# Patient Record
Sex: Male | Born: 1950 | Race: White | Hispanic: No | Marital: Married | Smoking: Former smoker
Health system: Southern US, Community
[De-identification: ages and names within clinical notes are randomized; demographics above are authoritative.]

## PROBLEM LIST (undated history)

## (undated) DIAGNOSIS — F32A Depression, unspecified: Secondary | ICD-10-CM

## (undated) DIAGNOSIS — F419 Anxiety disorder, unspecified: Secondary | ICD-10-CM

## (undated) DIAGNOSIS — E739 Lactose intolerance, unspecified: Secondary | ICD-10-CM

## (undated) DIAGNOSIS — G4733 Obstructive sleep apnea (adult) (pediatric): Secondary | ICD-10-CM

## (undated) DIAGNOSIS — N5089 Other specified disorders of the male genital organs: Secondary | ICD-10-CM

## (undated) DIAGNOSIS — M792 Neuralgia and neuritis, unspecified: Secondary | ICD-10-CM

## (undated) DIAGNOSIS — R42 Dizziness and giddiness: Secondary | ICD-10-CM

## (undated) DIAGNOSIS — C61 Malignant neoplasm of prostate: Secondary | ICD-10-CM

## (undated) DIAGNOSIS — I499 Cardiac arrhythmia, unspecified: Secondary | ICD-10-CM

## (undated) DIAGNOSIS — R5383 Other fatigue: Secondary | ICD-10-CM

## (undated) DIAGNOSIS — K219 Gastro-esophageal reflux disease without esophagitis: Secondary | ICD-10-CM

## (undated) DIAGNOSIS — E669 Obesity, unspecified: Secondary | ICD-10-CM

## (undated) DIAGNOSIS — I272 Pulmonary hypertension, unspecified: Secondary | ICD-10-CM

## (undated) DIAGNOSIS — F985 Adult onset fluency disorder: Secondary | ICD-10-CM

## (undated) DIAGNOSIS — F329 Major depressive disorder, single episode, unspecified: Secondary | ICD-10-CM

## (undated) DIAGNOSIS — G119 Hereditary ataxia, unspecified: Secondary | ICD-10-CM

## (undated) DIAGNOSIS — I8390 Asymptomatic varicose veins of unspecified lower extremity: Secondary | ICD-10-CM

## (undated) DIAGNOSIS — J329 Chronic sinusitis, unspecified: Secondary | ICD-10-CM

## (undated) DIAGNOSIS — R0602 Shortness of breath: Secondary | ICD-10-CM

## (undated) DIAGNOSIS — G4700 Insomnia, unspecified: Secondary | ICD-10-CM

## (undated) DIAGNOSIS — E785 Hyperlipidemia, unspecified: Secondary | ICD-10-CM

## (undated) DIAGNOSIS — M5410 Radiculopathy, site unspecified: Secondary | ICD-10-CM

## (undated) DIAGNOSIS — M1310 Monoarthritis, not elsewhere classified, unspecified site: Secondary | ICD-10-CM

## (undated) HISTORY — DX: Shortness of breath: R06.02

## (undated) HISTORY — DX: Chronic sinusitis, unspecified: J32.9

## (undated) HISTORY — DX: Morbid (severe) obesity due to excess calories: E66.01

## (undated) HISTORY — DX: Monoarthritis, not elsewhere classified, unspecified site: M13.10

## (undated) HISTORY — PX: APPENDECTOMY: SHX54

## (undated) HISTORY — DX: Malignant neoplasm of prostate: C61

## (undated) HISTORY — DX: Adult onset fluency disorder: F98.5

## (undated) HISTORY — DX: Other fatigue: R53.83

## (undated) HISTORY — DX: Obesity, unspecified: E66.9

## (undated) HISTORY — PX: VASCULAR SURGERY: SHX849

## (undated) HISTORY — PX: CARDIAC CATHETERIZATION: SHX172

## (undated) HISTORY — PX: TONSILLECTOMY: SUR1361

## (undated) HISTORY — DX: Asymptomatic varicose veins of unspecified lower extremity: I83.90

## (undated) HISTORY — DX: Neuralgia and neuritis, unspecified: M79.2

## (undated) HISTORY — DX: Obstructive sleep apnea (adult) (pediatric): G47.33

## (undated) HISTORY — DX: Major depressive disorder, single episode, unspecified: F32.9

## (undated) HISTORY — PX: TONSILLECTOMY: SHX5217

## (undated) HISTORY — DX: Pulmonary hypertension, unspecified: I27.20

## (undated) HISTORY — DX: Anxiety disorder, unspecified: F41.9

## (undated) HISTORY — DX: Lactose intolerance, unspecified: E73.9

## (undated) HISTORY — DX: Insomnia, unspecified: G47.00

## (undated) HISTORY — DX: Radiculopathy, site unspecified: M54.10

## (undated) HISTORY — DX: Gastro-esophageal reflux disease without esophagitis: K21.9

## (undated) HISTORY — DX: Dizziness and giddiness: R42

## (undated) HISTORY — DX: Hereditary ataxia, unspecified: G11.9

## (undated) HISTORY — DX: Hyperlipidemia, unspecified: E78.5

## (undated) HISTORY — DX: Depression, unspecified: F32.A

---

## 2004-01-05 HISTORY — PX: OTHER SURGICAL HISTORY: SHX169

## 2004-06-13 HISTORY — PX: COLONOSCOPY: SHX174

## 2004-08-06 HISTORY — PX: OTHER SURGICAL HISTORY: SHX169

## 2006-08-06 HISTORY — PX: CHOLECYSTECTOMY: SHX55

## 2007-02-22 ENCOUNTER — Emergency Department (HOSPITAL_COMMUNITY): Admission: EM | Admit: 2007-02-22 | Discharge: 2007-02-22 | Payer: Self-pay | Admitting: Emergency Medicine

## 2007-02-26 ENCOUNTER — Encounter: Payer: Self-pay | Admitting: Cardiology

## 2007-04-03 ENCOUNTER — Encounter: Payer: Self-pay | Admitting: Cardiology

## 2007-04-11 ENCOUNTER — Encounter: Payer: Self-pay | Admitting: Cardiology

## 2008-04-03 ENCOUNTER — Encounter: Payer: Self-pay | Admitting: Cardiology

## 2008-09-08 ENCOUNTER — Encounter: Payer: Self-pay | Admitting: Cardiology

## 2008-09-21 ENCOUNTER — Encounter: Payer: Self-pay | Admitting: Cardiology

## 2008-10-06 ENCOUNTER — Emergency Department (HOSPITAL_COMMUNITY): Admission: EM | Admit: 2008-10-06 | Discharge: 2008-10-06 | Payer: Self-pay | Admitting: Emergency Medicine

## 2008-10-13 ENCOUNTER — Encounter: Payer: Self-pay | Admitting: Cardiology

## 2009-02-10 ENCOUNTER — Emergency Department (HOSPITAL_COMMUNITY): Admission: EM | Admit: 2009-02-10 | Discharge: 2009-02-10 | Payer: Self-pay | Admitting: Emergency Medicine

## 2009-07-03 ENCOUNTER — Emergency Department (HOSPITAL_COMMUNITY): Admission: EM | Admit: 2009-07-03 | Discharge: 2009-07-03 | Payer: Self-pay | Admitting: Emergency Medicine

## 2010-08-06 HISTORY — PX: PROSTATE BIOPSY: SHX241

## 2010-08-06 HISTORY — PX: OTHER SURGICAL HISTORY: SHX169

## 2010-08-28 ENCOUNTER — Encounter: Payer: Self-pay | Admitting: Neurology

## 2010-09-04 ENCOUNTER — Ambulatory Visit
Admission: RE | Admit: 2010-09-04 | Discharge: 2010-09-05 | Payer: Self-pay | Source: Home / Self Care | Attending: Radiation Oncology | Admitting: Radiation Oncology

## 2010-09-07 ENCOUNTER — Ambulatory Visit: Payer: BC Managed Care – PPO | Attending: Radiation Oncology | Admitting: Radiation Oncology

## 2010-09-07 DIAGNOSIS — C61 Malignant neoplasm of prostate: Secondary | ICD-10-CM | POA: Insufficient documentation

## 2010-09-15 ENCOUNTER — Other Ambulatory Visit: Payer: Self-pay | Admitting: Urology

## 2010-09-15 DIAGNOSIS — C61 Malignant neoplasm of prostate: Secondary | ICD-10-CM

## 2010-09-18 ENCOUNTER — Encounter (HOSPITAL_BASED_OUTPATIENT_CLINIC_OR_DEPARTMENT_OTHER)
Admission: RE | Admit: 2010-09-18 | Discharge: 2010-09-18 | Disposition: A | Discharge status: Home / Self Care | Payer: BC Managed Care – PPO | Source: Ambulatory Visit | Attending: Urology | Admitting: Urology

## 2010-09-18 ENCOUNTER — Ambulatory Visit
Admission: RE | Admit: 2010-09-18 | Discharge: 2010-09-18 | Disposition: A | Discharge status: Home / Self Care | Payer: BC Managed Care – PPO | Source: Ambulatory Visit | Attending: Urology | Admitting: Urology

## 2010-09-18 DIAGNOSIS — Z01812 Encounter for preprocedural laboratory examination: Secondary | ICD-10-CM | POA: Insufficient documentation

## 2010-09-18 DIAGNOSIS — C61 Malignant neoplasm of prostate: Secondary | ICD-10-CM

## 2010-10-24 LAB — COMPREHENSIVE METABOLIC PANEL
ALT: 22 U/L (ref 0–53)
AST: 21 U/L (ref 0–37)
Albumin: 3.9 g/dL (ref 3.5–5.2)
Calcium: 8.8 mg/dL (ref 8.4–10.5)
Chloride: 108 mEq/L (ref 96–112)
Creatinine, Ser: 1.06 mg/dL (ref 0.4–1.5)
GFR calc Af Amer: >60 mL/min (ref >60)
Sodium: 140 mEq/L (ref 135–145)
Total Bilirubin: 0.6 mg/dL (ref 0.3–1.2)

## 2010-10-24 LAB — CBC
HCT: 42.3 % (ref 39.0–52.0)
Hemoglobin: 13.8 g/dL (ref 13.0–17.0)
MCH: 29.3 pg (ref 26.0–34.0)
MCV: 89.8 fL (ref 78.0–100.0)
Platelets: 199 10*3/uL (ref 150–400)
RBC: 4.71 MIL/uL (ref 4.22–5.81)
WBC: 5.6 10*3/uL (ref 4.0–10.5)

## 2010-10-24 LAB — APTT: aPTT: 31 seconds (ref 24–37)

## 2010-10-31 ENCOUNTER — Ambulatory Visit (HOSPITAL_BASED_OUTPATIENT_CLINIC_OR_DEPARTMENT_OTHER)
Admission: RE | Admit: 2010-10-31 | Discharge: 2010-10-31 | Disposition: A | Discharge status: Home / Self Care | Payer: BC Managed Care – PPO | Source: Ambulatory Visit | Attending: Urology | Admitting: Urology

## 2010-10-31 ENCOUNTER — Ambulatory Visit (HOSPITAL_COMMUNITY): Payer: BC Managed Care – PPO | Attending: Urology

## 2010-10-31 DIAGNOSIS — Z01812 Encounter for preprocedural laboratory examination: Secondary | ICD-10-CM | POA: Insufficient documentation

## 2010-10-31 DIAGNOSIS — C61 Malignant neoplasm of prostate: Secondary | ICD-10-CM | POA: Insufficient documentation

## 2010-10-31 DIAGNOSIS — Z01810 Encounter for preprocedural cardiovascular examination: Secondary | ICD-10-CM | POA: Insufficient documentation

## 2010-11-02 NOTE — Op Note (Signed)
  NAMECHRISTIE, Richard Bradshaw                 ACCOUNT NO.:  [id number]  MEDICAL RECORD NO.:  [id number]          PATIENT TYPE:  REC  LOCATION:  [facility]                         FACILITY:  [facility]  PHYSICIAN:  Maretta Bees. Vonita Moss, M.D.DATE OF BIRTH:  1951-07-28  DATE OF PROCEDURE:  10/31/2010 DATE OF DISCHARGE:  09/05/2010                              OPERATIVE REPORT   PREOPERATIVE DIAGNOSIS:  Prostatic carcinoma.  POSTOPERATIVE DIAGNOSIS:  Prostatic carcinoma.  PROCEDURE:  Radioactive seed implantation and cystoscopy.  SURGEON:  Maretta Bees. Vonita Moss, M.D.  ANESTHESIA:  General.  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 He was counseled about observation versus definitive therapy.  He has opted for radioactive seed implantation.  He is voiding fairly well.  ASSISTANT:  Maryln Gottron, M.D.  DESCRIPTION OF PROCEDURE:  The patient is brought to the operating room, placed in lithotomy position.  The external genitalia and lower abdomen and perineum prepped and draped in the usual fashion.  He underwent transrectal ultrasound treatment planning.  With Foley catheter in place, he had radioactive seed implantation with placement of 25 needles and delivery of 83 seeds with total apparent activity of 39.3420 mCi using I-125 seeds.  Fluoroscopic and ultrasonic visualization demonstrated good placement of the seeds.  The Foley catheter was removed.  He underwent cystoscopy, and there was no evidence of seeds or injury to the prostatic urethra or bladder.  Foley catheter was reinserted, and he was taken to recovery room in good condition having tolerated the procedure well.     Maretta Bees. Vonita Moss, M.D.     LJP/MEDQ  D:  10/31/2010  T:  10/31/2010  Job:  [id number]  cc:   Maryln Gottron, M.D. Fax: [phone]  Electronically Signed by Larey Dresser M.D. on 11/02/2010 82:95:62 PM

## 2010-11-12 LAB — GLUCOSE, CAPILLARY: Glucose-Capillary: 124 mg/dL — ABNORMAL HIGH (ref 70–99)

## 2010-11-16 LAB — POCT I-STAT, CHEM 8
BUN: 25 mg/dL — ABNORMAL HIGH (ref 6–23)
Chloride: 105 mEq/L (ref 96–112)
Creatinine, Ser: 1.4 mg/dL (ref 0.4–1.5)
Sodium: 141 mEq/L (ref 135–145)

## 2010-11-21 ENCOUNTER — Other Ambulatory Visit: Payer: Self-pay | Admitting: Radiation Oncology

## 2010-11-23 ENCOUNTER — Other Ambulatory Visit: Payer: Self-pay | Admitting: Radiation Oncology

## 2010-11-23 DIAGNOSIS — C61 Malignant neoplasm of prostate: Secondary | ICD-10-CM

## 2010-11-29 ENCOUNTER — Ambulatory Visit (HOSPITAL_COMMUNITY)
Admission: RE | Admit: 2010-11-29 | Discharge: 2010-11-29 | Disposition: A | Discharge status: Home / Self Care | Payer: BC Managed Care – PPO | Source: Ambulatory Visit | Attending: Radiation Oncology | Admitting: Radiation Oncology

## 2010-11-29 DIAGNOSIS — C61 Malignant neoplasm of prostate: Secondary | ICD-10-CM

## 2011-04-29 IMAGING — CR DG CHEST 2V
2 series · 2 of 2 positions shown · non-contrast
Comparison: 02/22/2007

CLINICAL DATA: Prostate cancer.  Preoperative evaluation.

CHEST - 2 VIEW

[w chest pa]
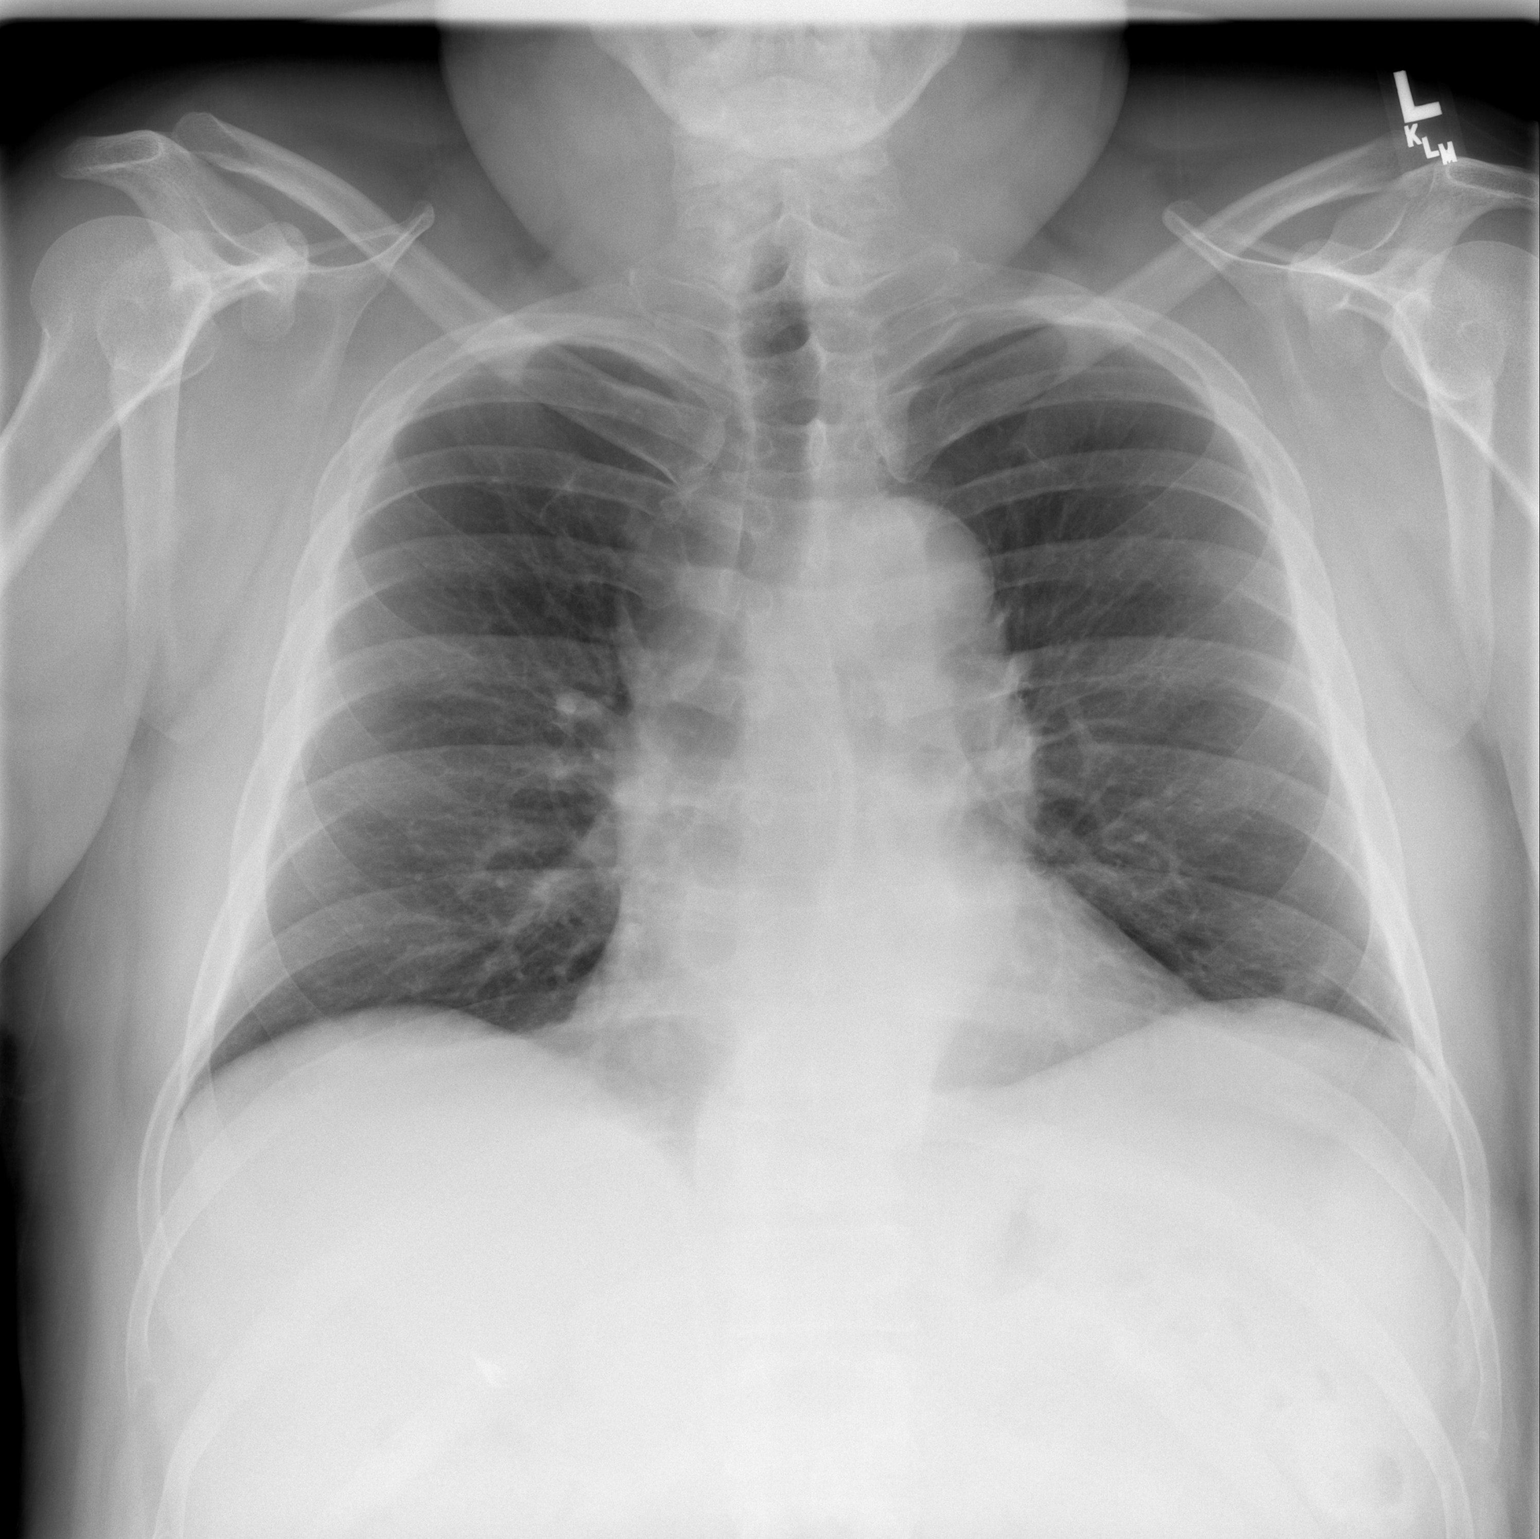

[w chest lat]
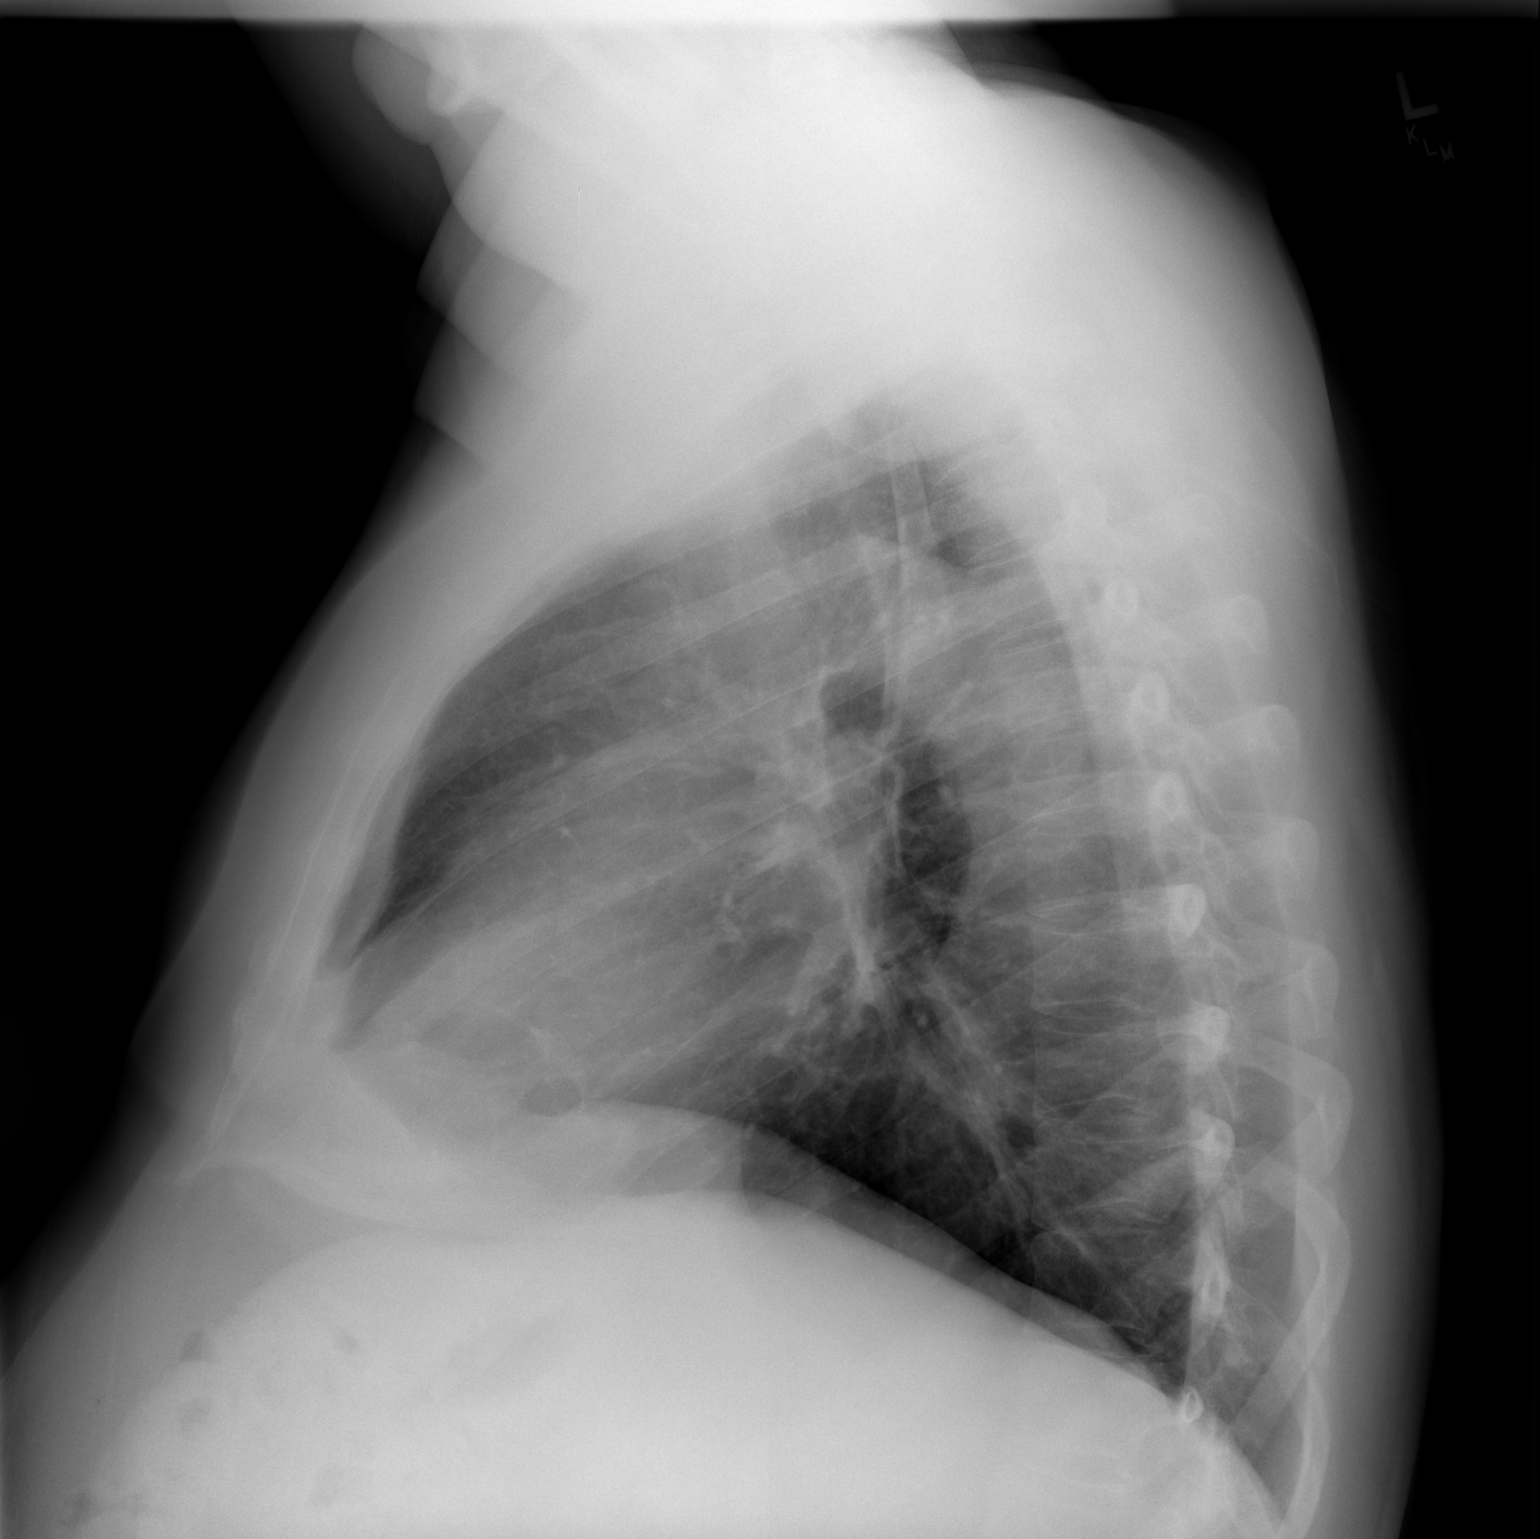

[2 of 2 positions shown; findings below may reference images not displayed]

FINDINGS: The lungs are clear bilaterally.  Minimal atelectasis is
seen at the lung bases, bilaterally.  No confluent airspace
opacities, pleural effuions or pneumothoracies are seen.  The heart
is normal in size and contour.  The upper abdomen and osseous
structures are normal.
IMPRESSION: No acute cardiopulmonary disease.

## 2011-05-21 LAB — BASIC METABOLIC PANEL
Chloride: 112
GFR calc Af Amer: >60
Potassium: 4

## 2011-05-21 LAB — POCT CARDIAC MARKERS
CKMB, poc: <1 — ABNORMAL LOW
Myoglobin, poc: 61.2
Operator id: 1211
Troponin i, poc: <0.05
Troponin i, poc: <0.05

## 2011-05-21 LAB — CBC
HCT: 43.1
Hemoglobin: 14.7
MCV: 85.6
RBC: 5.03
WBC: 7

## 2011-08-16 ENCOUNTER — Ambulatory Visit (INDEPENDENT_AMBULATORY_CARE_PROVIDER_SITE_OTHER): Payer: BC Managed Care – PPO | Admitting: Cardiology

## 2011-08-16 ENCOUNTER — Encounter: Payer: Self-pay | Admitting: Cardiology

## 2011-08-16 DIAGNOSIS — I2789 Other specified pulmonary heart diseases: Secondary | ICD-10-CM

## 2011-08-16 DIAGNOSIS — I10 Essential (primary) hypertension: Secondary | ICD-10-CM

## 2011-08-16 DIAGNOSIS — G4730 Sleep apnea, unspecified: Secondary | ICD-10-CM

## 2011-08-16 DIAGNOSIS — I4891 Unspecified atrial fibrillation: Secondary | ICD-10-CM

## 2011-08-16 DIAGNOSIS — G4733 Obstructive sleep apnea (adult) (pediatric): Secondary | ICD-10-CM | POA: Insufficient documentation

## 2011-08-16 DIAGNOSIS — I272 Pulmonary hypertension, unspecified: Secondary | ICD-10-CM | POA: Insufficient documentation

## 2011-08-16 DIAGNOSIS — R079 Chest pain, unspecified: Secondary | ICD-10-CM | POA: Insufficient documentation

## 2011-08-16 NOTE — Patient Instructions (Signed)
We will request copies of your records from [facility] and Dr. Vickey Huger.  We will schedule you for lab work, Echocardiogram, and Heart catheterization in February.

## 2011-08-16 NOTE — Progress Notes (Signed)
Richard Bradshaw Date of Birth: 08-19-1950 Medical Record #[id number]  History of Present Illness: [name] is a pleasant 61 year old white male seen at the request of Dr. Ashley Royalty for chest pain. He has a complex medical history. He has a history of morbid obesity with obstructive sleep apnea. He has a history of significant pulmonary hypertension. He reports that he has had chest pain off and on for the past 5 years. Now his pain is almost constant. He has had extensive evaluation at the hospital in [facility] as well as in [place]. He reports that he has had several nuclear stress tests in the past and has had at least 2 heart catheterizations. His last heart catheterization in 2008 showed no significant coronary disease but he did have significant pulmonary hypertension. He apparently has been offered PDE 5 inhibitors in the past but was unable to afford these medications. He describes his pain as a heaviness or pain in the midchest that radiates into his left arm. It is associated with exertion. There are no alleviating factors. He does have chronic dyspnea but reports his oxygen levels have been normal.  No current outpatient prescriptions on file prior to visit.    Allergies  Allergen Reactions  . Penicillins     Past Medical History  Diagnosis Date  . Diabetes mellitus     type 2  . 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 Hyperlipidemia   . Obesity   . Anxiety   . Adult onset fluency disorder   . Depressive disorder   . Obstructive sleep apnea     on CPAP  . Cerebellar ataxia   . Essential hypertension   . Angina pectoris   . Varicose vein   . Sinusitis   . Esophageal reflux   . Hypertrophy (benign) of prostate   . Hyperplasia of prostate   . Monoarthritis     lower leg  . Neuralgia   . Neuritis   . Radiculitis   . Insomnia   . Fatigue   . SOB (shortness of breath)   . Elevated prostate specific antigen (PSA)   .  Dizziness   . Pulmonary HTN     Past Surgical History  Procedure Date  . Appendectomy   . Tonsillectomy   . Cholecystectomy 2008  . Colonoscopy 06/13/2004  . Other surgical history 01/05/2004    ETT--Nuclear  . Atrial fibrillation ablation 2006    x2    History  Smoking status  . Former Smoker  . Quit date: 08/15/1978  Smokeless tobacco  . Not on file    History  Alcohol Use No    Family History  Problem Relation Age of Onset  . Hypertension Mother   . Diabetes Mother   . Cancer Father   . Hypertension Sister     Review of Systems: The review of systems is positive for dyspnea and chest pain as noted. He denies any increased abdominal girth or edema. He has no orthopnea. He has had no cough or fever. All other systems were reviewed and are negative.  Physical Exam: BP 128/78  Pulse 99  Ht 5\' 9"  (1.753 m)  Wt 244 lb (110.678 kg)  BMI 36.03 kg/m2 He is a morbidly obese white male in no acute distress.The patient is alert and oriented x 3.  The mood and affect are normal.  The skin is warm and dry.  Color is normal.  The HEENT exam reveals that the sclera are nonicteric.  The mucous membranes are moist.  The carotids are 2+ without bruits.  There is no thyromegaly.  There is no JVD.  The lungs are clear.  The chest wall is non tender.  The heart exam reveals a regular rate with a normal S1 and S2. Heart sounds are distant There are no murmurs, gallops, or rubs.  The PMI is not well palpated.   Abdominal exam reveals good bowel sounds.  There is no guarding or rebound.  There is no hepatosplenomegaly or tenderness.  There are no masses.  Exam of the legs reveal no clubbing, cyanosis, or edema.  The legs are without rashes.  The distal pulses are intact.  Cranial nerves II - XII are intact.  Motor and sensory functions are intact.  The gait is normal. LABORATORY DATA:   Assessment / Plan: ECG today demonstrates normal sinus rhythm with occasional PVCs. It is otherwise  normal.

## 2011-08-16 NOTE — Assessment & Plan Note (Signed)
I suspect that his current chest pain symptoms are related to his pulmonary hypertension. It has been several years since was formally evaluated. I think we need to repeat an echocardiogram and a right and left heart catheterization. We will rule out coronary disease. If indeed he has significant pulmonary hypertension then we will need to reconsider options for treatment. I've asked that he see one of our pulmonary specialists. I have requested copies of his records from previous evaluations. We will tentatively schedule these procedures in February since he is switching to [facility] on February 1.

## 2011-08-22 ENCOUNTER — Telehealth: Payer: Self-pay | Admitting: Cardiology

## 2011-08-22 NOTE — Telephone Encounter (Addendum)
Faxed 2 Release Forms to Obtain all Records for Dr.Jordan to :  1. [facility] @ [phone] 2.Dr.Doheimer @ [phone] 08/22/11/KM  Records Received from Dr.Doheimer's Office Gave to [facility] 08/22/11/KM  Records Received from [facility] gave to [facility]  08/23/11/KM

## 2011-08-29 ENCOUNTER — Institutional Professional Consult (permissible substitution): Payer: BC Managed Care – PPO | Admitting: Cardiology

## 2011-08-31 ENCOUNTER — Institutional Professional Consult (permissible substitution): Payer: BC Managed Care – PPO | Admitting: Cardiology

## 2011-09-12 ENCOUNTER — Other Ambulatory Visit: Payer: Self-pay

## 2011-09-12 ENCOUNTER — Other Ambulatory Visit: Payer: Self-pay | Admitting: Cardiology

## 2011-09-12 DIAGNOSIS — I2789 Other specified pulmonary heart diseases: Secondary | ICD-10-CM

## 2011-09-12 DIAGNOSIS — I4891 Unspecified atrial fibrillation: Secondary | ICD-10-CM

## 2011-09-12 DIAGNOSIS — I272 Pulmonary hypertension, unspecified: Secondary | ICD-10-CM

## 2011-09-13 ENCOUNTER — Ambulatory Visit (INDEPENDENT_AMBULATORY_CARE_PROVIDER_SITE_OTHER)
Admission: RE | Admit: 2011-09-13 | Discharge: 2011-09-13 | Disposition: A | Discharge status: Home / Self Care | Payer: Medicare Other | Source: Ambulatory Visit | Attending: Cardiology | Admitting: Cardiology

## 2011-09-13 ENCOUNTER — Other Ambulatory Visit (INDEPENDENT_AMBULATORY_CARE_PROVIDER_SITE_OTHER): Payer: Medicare Other | Admitting: *Deleted

## 2011-09-13 DIAGNOSIS — I4891 Unspecified atrial fibrillation: Secondary | ICD-10-CM

## 2011-09-13 DIAGNOSIS — R079 Chest pain, unspecified: Secondary | ICD-10-CM

## 2011-09-13 DIAGNOSIS — I2789 Other specified pulmonary heart diseases: Secondary | ICD-10-CM

## 2011-09-13 LAB — BASIC METABOLIC PANEL
BUN: 16 mg/dL (ref 6–23)
CO2: 30 mEq/L (ref 19–32)
Calcium: 9.2 mg/dL (ref 8.4–10.5)
Creatinine, Ser: 1 mg/dL (ref 0.4–1.5)

## 2011-09-13 LAB — APTT: aPTT: 26 s (ref 21.7–28.8)

## 2011-09-13 LAB — PROTIME-INR
INR: 0.9 ratio (ref 0.8–1.0)
Prothrombin Time: 10.1 s — ABNORMAL LOW (ref 10.2–12.4)

## 2011-09-13 LAB — CBC WITH DIFFERENTIAL/PLATELET
Basophils Absolute: 0 10*3/uL (ref 0.0–0.1)
Basophils Relative: 0.7 % (ref 0.0–3.0)
Eosinophils Absolute: 0.1 10*3/uL (ref 0.0–0.7)
Lymphocytes Relative: 32.3 % (ref 12.0–46.0)
MCHC: 33.4 g/dL (ref 30.0–36.0)
Neutrophils Relative %: 57.7 % (ref 43.0–77.0)
Platelets: 204 10*3/uL (ref 150.0–400.0)
RBC: 4.61 Mil/uL (ref 4.22–5.81)

## 2011-09-13 NOTE — Progress Notes (Signed)
Quick Note:  High blood sugar, otherwise labs ok. Andrina Locken [place] MD, [facility]   ______

## 2011-09-20 ENCOUNTER — Telehealth: Payer: Self-pay | Admitting: Cardiology

## 2011-09-20 ENCOUNTER — Encounter (HOSPITAL_BASED_OUTPATIENT_CLINIC_OR_DEPARTMENT_OTHER)
Admission: RE | Disposition: A | Discharge status: Home / Self Care | Payer: Self-pay | Source: Ambulatory Visit | Attending: Cardiology

## 2011-09-20 ENCOUNTER — Inpatient Hospital Stay (HOSPITAL_BASED_OUTPATIENT_CLINIC_OR_DEPARTMENT_OTHER)
Admission: RE | Admit: 2011-09-20 | Discharge: 2011-09-20 | Disposition: A | Discharge status: Home / Self Care | Payer: Medicare Other | Source: Ambulatory Visit | Attending: Cardiology | Admitting: Cardiology

## 2011-09-20 DIAGNOSIS — R0989 Other specified symptoms and signs involving the circulatory and respiratory systems: Secondary | ICD-10-CM | POA: Insufficient documentation

## 2011-09-20 DIAGNOSIS — R0602 Shortness of breath: Secondary | ICD-10-CM

## 2011-09-20 DIAGNOSIS — I272 Pulmonary hypertension, unspecified: Secondary | ICD-10-CM

## 2011-09-20 DIAGNOSIS — R0609 Other forms of dyspnea: Secondary | ICD-10-CM | POA: Insufficient documentation

## 2011-09-20 LAB — POCT I-STAT 3, VENOUS BLOOD GAS (G3P V)
pCO2, Ven: 47.4 mmHg (ref 45.0–50.0)
pH, Ven: 7.336 — ABNORMAL HIGH (ref 7.250–7.300)

## 2011-09-20 LAB — POCT I-STAT GLUCOSE
Glucose, Bld: 104 mg/dL — ABNORMAL HIGH (ref 70–99)
Operator id: 141321

## 2011-09-20 SURGERY — JV LEFT AND RIGHT HEART CATHETERIZATION WITH CORONARY ANGIOGRAM
Anesthesia: Moderate Sedation

## 2011-09-20 MED ORDER — SODIUM CHLORIDE 0.9 % IJ SOLN: 3.0000 mL | Freq: Two times a day (BID) | INTRAMUSCULAR | Status: DC

## 2011-09-20 MED ORDER — SODIUM CHLORIDE 0.9 % IJ SOLN: 3.0000 mL | INTRAMUSCULAR | Status: DC | PRN

## 2011-09-20 MED ORDER — SODIUM CHLORIDE 0.9 % IV SOLN: 250.0000 mL | INTRAVENOUS | Status: DC | PRN

## 2011-09-20 MED ORDER — SODIUM CHLORIDE 0.9 % IV SOLN
INTRAVENOUS | Status: DC
  Administered 2011-09-20: 09:00:00 via INTRAVENOUS

## 2011-09-20 MED ORDER — DIAZEPAM 5 MG PO TABS
5.0000 mg | ORAL_TABLET | ORAL | Status: AC
  Administered 2011-09-20: 5 mg via ORAL

## 2011-09-20 MED ORDER — ASPIRIN 81 MG PO CHEW
324.0000 mg | CHEWABLE_TABLET | ORAL | Status: AC
  Administered 2011-09-20: 324 mg via ORAL

## 2011-09-20 MED ORDER — ACETAMINOPHEN 325 MG PO TABS: 650.0000 mg | ORAL_TABLET | ORAL | Status: DC | PRN

## 2011-09-20 MED ORDER — SODIUM CHLORIDE 0.9 % IV SOLN: INTRAVENOUS | Status: DC

## 2011-09-20 MED ORDER — ONDANSETRON HCL 4 MG/2ML IJ SOLN: 4.0000 mg | Freq: Four times a day (QID) | INTRAMUSCULAR | Status: DC | PRN

## 2011-09-20 NOTE — Telephone Encounter (Signed)
Patient's wife called,stated patient had a cath today and had several questions for Dr.Jordan.States when patient had cath before he was told he had mild pulmonary htn,today he was told no pulmonary htn,during cath Dr.Hochrein noticed sob,sleep apnea,patient's b/p has been elevated for the past 2 weeks with lower reading in 90's.Wife stated she wanted to get Dr.Jordan's opinion on these questions.Wife was told I will let Dr.Jordan know tomorrow 09/21/11 and I will call her back.

## 2011-09-20 NOTE — Progress Notes (Signed)
Bedrest begins @ 1130, Dr. Antoine Poche in to discuss results.

## 2011-09-20 NOTE — Procedures (Signed)
  Cardiac Catheterization Procedure Note  Name: Richard Bradshaw MRN: [id number] DOB: 29-Mar-1951  Procedure: Right Heart Cath, Left Heart Cath, Selective Coronary Angiography, LV angiography  Indication:    Procedural Details: The right groin was prepped, draped, and anesthetized with 1% lidocaine. Using the modified Seldinger technique a 4 French sheath was placed in the right femoral artery and a 7 French sheath was placed in the right femoral vein. A Swan-Ganz catheter was used for the right heart catheterization. Standard protocol was followed for recording of right heart pressures and sampling of oxygen saturations. Fick cardiac output was calculated. Standard Judkins catheters were used for selective coronary angiography and left ventriculography. There were no immediate procedural complications. The patient was transferred to the post catheterization recovery area for further monitoring.  Procedural Findings: Hemodynamics:               RA 2    RV 26/4    PA 22/9 mean 16    PCWP 3    LV 141/13    AO 133/82   Oxygen saturations:    PA 66    AO 95   Cardiac Output (Fick) 5.2                             Cardiac Index (Fick) 2.3  Coronary angiography: Coronary dominance: right  Left mainstem: Normal  Left anterior descending (LAD): Proximal 25%.  Mid diagonal large and normal.  Left circumflex (LCx): Normal AV groove, OM 1 normal, OM 2 normal  Right coronary artery (RCA): Large dominant normal, PDA large normal, PL large normal.  Left ventriculography: Left ventricular systolic function is normal, LVEF is estimated at 55-65%, there is no significant mitral regurgitation   Final Conclusions:  Mild coronary plaque, NL LV function and normal right heart pressures.  Recommendations: Further work up of dypsnea per Dr. [place].  No obvious cardiac etiology and no pulmonary hypertension on this study.   Rollene Rotunda 09/20/2011, 11:15 AM

## 2011-09-20 NOTE — Telephone Encounter (Signed)
New msg Pt's wife wants to talk to you about heart cath her husband had today. Please call he back

## 2011-09-20 NOTE — Progress Notes (Signed)
Discharge instructions completed with patient and wife, ambulated to bathroom without bleeding from right groin site.  Discharged to home via wheelchair with wife.

## 2011-09-20 NOTE — Interval H&P Note (Signed)
History and Physical Interval Note:  09/20/2011 10:25 AM  Richard Bradshaw  has presented today for surgery, with the diagnosis of chest pain  The various methods of treatment have been discussed with the patient and family. After consideration of risks, benefits and other options for treatment, the patient has consented to  Procedure(s) (LRB): JV LEFT AND RIGHT HEART CATHETERIZATION WITH CORONARY ANGIOGRAM (N/A) as a surgical intervention .  The patients' history has been reviewed, patient examined, no change in status, stable for surgery.  I have reviewed the patients' chart and labs.  Questions were answered to the patient's satisfaction.     Richard Bradshaw

## 2011-09-20 NOTE — H&P (View-Only) (Signed)
Quick Note:  High blood sugar, otherwise labs ok. Alara Daniel MD, FACC   ______ 

## 2011-09-21 NOTE — Telephone Encounter (Signed)
Patient's wife was called and told Dr.Jordan was told of questions and advised needs to keep appointment with [name] NP 10/02/11.Advised to keep diary of B/P readings and bring with you to appointment.

## 2011-09-25 ENCOUNTER — Other Ambulatory Visit: Payer: Self-pay

## 2011-09-25 ENCOUNTER — Ambulatory Visit (HOSPITAL_COMMUNITY): Payer: Medicare Other | Attending: Cardiology

## 2011-09-25 DIAGNOSIS — I2789 Other specified pulmonary heart diseases: Secondary | ICD-10-CM

## 2011-09-25 DIAGNOSIS — I4891 Unspecified atrial fibrillation: Secondary | ICD-10-CM

## 2011-09-25 DIAGNOSIS — I059 Rheumatic mitral valve disease, unspecified: Secondary | ICD-10-CM | POA: Insufficient documentation

## 2011-09-25 DIAGNOSIS — R072 Precordial pain: Secondary | ICD-10-CM

## 2011-09-25 DIAGNOSIS — I079 Rheumatic tricuspid valve disease, unspecified: Secondary | ICD-10-CM | POA: Insufficient documentation

## 2011-09-26 ENCOUNTER — Encounter: Payer: Self-pay | Admitting: Cardiology

## 2011-09-26 ENCOUNTER — Telehealth: Payer: Self-pay | Admitting: Cardiology

## 2011-09-26 NOTE — Telephone Encounter (Signed)
Returned a page from Richard Bradshaw concerning Richard blood pressure. I spoke with him and Richard Bradshaw who report that Richard BP has been elevated and he has had a headache since yesterday. Richard BP was 150/92 yesterday and 164/99 this evening. He denies visual changes, dizziness, syncope, sob, or worsening of Richard chronic chest pain. He takes Lasix 20mg  daily and Hyzaar 50/12.5mg  daily. He has an appointment with [name], NP on 10/02/11 and was already instructed to log BP and bring it to Richard visit. I instructed him to take Richard Hyzaar twice daily as long as Richard SBP is > 100. If he develops worsening HA or any of the symptoms above he agrees to go to the ED. Otherwise he will need a BMET and f/u with [name] as already scheduled.  Richard Dobek PA-C 09/26/2011   6:55 PM

## 2011-10-02 ENCOUNTER — Encounter: Payer: Self-pay | Admitting: Nurse Practitioner

## 2011-10-02 ENCOUNTER — Ambulatory Visit (INDEPENDENT_AMBULATORY_CARE_PROVIDER_SITE_OTHER): Payer: Medicare Other | Admitting: Nurse Practitioner

## 2011-10-02 DIAGNOSIS — I272 Pulmonary hypertension, unspecified: Secondary | ICD-10-CM

## 2011-10-02 DIAGNOSIS — I10 Essential (primary) hypertension: Secondary | ICD-10-CM

## 2011-10-02 DIAGNOSIS — I2789 Other specified pulmonary heart diseases: Secondary | ICD-10-CM

## 2011-10-02 MED ORDER — LOSARTAN POTASSIUM-HCTZ 100-25 MG PO TABS: 1.0000 | ORAL_TABLET | Freq: Every day | ORAL | Status: AC

## 2011-10-02 NOTE — Assessment & Plan Note (Signed)
No evidence per recent left and right heart catheterization.

## 2011-10-02 NOTE — Assessment & Plan Note (Signed)
Blood pressure is not at goal. I have asked him to increase the Hyzaar to 100/25 mg. He will see his PCP in a month and have repeat labs to check his renal function.

## 2011-10-02 NOTE — Patient Instructions (Signed)
Take the Hyzaar 100/25 every day. I have sent this prescription to [facility]  OK to start exercising. Start slow. Goal is 1 hour per day.  Here are my tips to lose weight:  1. Drink only water. You do not need milk, juice, tea, soda or diet soda.  2. Do not eat anything "white". This includes white bread, potatoes, rice or mayo  3. Stay away from fried foods and sweets  4. Your portion should be the size of the palm of your hand.  5. Know what your weaknesses are and avoid.  6. Find an exercise you like and do it every day for 45 to 60 minutes.   See your PCP with repeat blood work to check your kidney function in about one month.  Call the [facility] office at [phone] if you have any questions, problems or concerns.

## 2011-10-02 NOTE — Assessment & Plan Note (Signed)
His weight is felt to be the most contributing factor for his dyspnea. He does not have significant coronary disease. He has LV function. He does not have evidence of pulmonary HTN. He does not wish to have further testing at this time. He would like to try working on his diet and weight. He does seem motivated to make changes and we discussed this in great detail today. We will be available prn. Patient is agreeable to this plan and will call if any problems develop in the interim.

## 2011-10-02 NOTE — Progress Notes (Signed)
2  Richard Bradshaw Date of Birth: 03/28/1951 Medical Record #[id number]  History of Present Illness: [name] is seen back today for a post cath visit. He is seen for Dr. Sw[place]il[place]. He is a very pleasant 61 year old male with long standing shortness of breath. He has had presumed pulmonary HTN. He has had an extensive work up in the past. He was referred to Dr. Sw[place]iland for left and right heart catheterization. This procedure was carried out by Dr. Antoine Poche on February 14th. He has mild coronary plaque with a 25% proximal LAD lesion, normal LV function, normal right heart pressures. There is no cardiac etiology and no pulmonary HTN based on our study. He has also had an echo which shows normal EF with mild LVH.   He comes back in today. He feels the same. He continues to have shortness of breath. He feels like he has had enough testing and now needs to focus on weight loss and exercise. He is interested in starting an exercise program. He has had no issues with his groin. He has noticed some elevation in his blood pressures. He has taken an extra Hyzaar a couple of times since his procedure. He is diabetic and goal blood pressure should be 120/80.   Current Outpatient Prescriptions on File Prior to Visit  Medication Sig Dispense Refill  . aspirin 81 MG tablet Take 81 mg by mouth daily.       Marland Kitchen dicyclomine (BENTYL) 10 MG capsule Take 10 mg by mouth daily.       . DULoxetine (CYMBALTA) 60 MG capsule Take 60 mg by mouth daily.      Marland Kitchen EPINEPHrine (EPIPEN IJ) Inject as directed. 0.3 mg       . esomeprazole (NEXIUM) 40 MG capsule Take 40 mg by mouth daily before breakfast.      . furosemide (LASIX) 20 MG tablet Take 20 mg by mouth daily.      Marland Kitchen glimepiride (AMARYL) 1 MG tablet Take 1 mg by mouth 2 (two) times daily.      . Glucose Blood (FREESTYLE LITE TEST VI) by In Vitro route.      . nitroGLYCERIN (NITROSTAT) 0.4 MG SL tablet Place 0.4 mg under the tongue every 5 (five) minutes as needed.        Allergies   Allergen Reactions  . Penicillins     Past Medical History  Diagnosis Date  . Diabetes mellitus     type 2  . 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 Hyperlipidemia   . Obesity   . Anxiety   . Adult onset fluency disorder   . Depressive disorder   . Obstructive sleep apnea     on CPAP  . Cerebellar ataxia   . Essential hypertension   . Angina pectoris     s/p cath 09/2011 with mild CAD, normal LV function and normal right heart pressures.   . Varicose vein   . Sinusitis   . Esophageal reflux   . Hypertrophy (benign) of prostate   . Hyperplasia of prostate   . Monoarthritis     lower leg  . Neuralgia   . Neuritis   . Radiculitis   . Insomnia   . Fatigue   . SOB (shortness of breath)     chronic  . Prostate cancer   . Dizziness   . Pulmonary HTN     not noted on cath 09/2011; Had normal right heart pressures  . Morbid obesity  Past Surgical History  Procedure Date  . Appendectomy   . Tonsillectomy   . Cholecystectomy 2008  . Colonoscopy 06/13/2004  . Other surgical history 01/05/2004    ETT--Nuclear  . Atrial fibrillation ablation 2006    x2    History  Smoking status  . Former Smoker  . Quit date: 08/15/1978  Smokeless tobacco  . Not on file    History  Alcohol Use No    Family History  Problem Relation Age of Onset  . Hypertension Mother   . Diabetes Mother   . Cancer Father   . Hypertension Sister     Review of Systems: The review of systems is positive for chronic dyspnea.  All other systems were reviewed and are negative.  Physical Exam: BP 138/92  Pulse 88  Ht 5\' 9"  (1.753 m)  Wt 239 lb (108.41 kg)  BMI 35.29 kg/m2 Patient is very pleasant and in no acute distress. He is obese. Skin is warm and dry. Color is normal.  HEENT is unremarkable. Normocephalic/atraumatic. PERRL. Sclera are nonicteric. Neck is supple. No masses. No JVD. Lungs are clear. Cardiac exam shows a regular rate and rhythm. Abdomen  is obese but soft. Extremities are without edema. Gait and ROM are intact. No gross neurologic deficits noted.   Lab Results  Component Value Date   WBC 4.8 09/13/2011   HGB 13.8 09/13/2011   HCT 41.2 09/13/2011   PLT 204.0 09/13/2011   GLUCOSE 104* 09/20/2011   ALT 22 10/24/2010   AST 21 10/24/2010   NA 137 09/13/2011   K 4.0 09/13/2011   CL 102 09/13/2011   CREATININE 1.0 09/13/2011   BUN 16 09/13/2011   CO2 30 09/13/2011   INR 0.9 09/13/2011      Assessment / Plan:

## 2011-10-04 ENCOUNTER — Encounter: Payer: Medicare Other | Admitting: Physician Assistant

## 2011-10-25 ENCOUNTER — Ambulatory Visit (INDEPENDENT_AMBULATORY_CARE_PROVIDER_SITE_OTHER): Payer: Medicare Other | Admitting: *Deleted

## 2011-10-25 DIAGNOSIS — I10 Essential (primary) hypertension: Secondary | ICD-10-CM

## 2011-10-25 DIAGNOSIS — I2789 Other specified pulmonary heart diseases: Secondary | ICD-10-CM

## 2011-10-25 DIAGNOSIS — R079 Chest pain, unspecified: Secondary | ICD-10-CM

## 2011-10-25 DIAGNOSIS — I272 Pulmonary hypertension, unspecified: Secondary | ICD-10-CM

## 2011-10-25 LAB — HEPATIC FUNCTION PANEL: Total Bilirubin: 0.4 mg/dL (ref 0.3–1.2)

## 2012-03-11 ENCOUNTER — Emergency Department (HOSPITAL_COMMUNITY)
Admission: EM | Admit: 2012-03-11 | Discharge: 2012-03-11 | Disposition: A | Discharge status: Home / Self Care | Payer: Medicare Other | Attending: Emergency Medicine | Admitting: Emergency Medicine

## 2012-03-11 DIAGNOSIS — I10 Essential (primary) hypertension: Secondary | ICD-10-CM | POA: Insufficient documentation

## 2012-03-11 DIAGNOSIS — Z23 Encounter for immunization: Secondary | ICD-10-CM | POA: Insufficient documentation

## 2012-03-11 DIAGNOSIS — E669 Obesity, unspecified: Secondary | ICD-10-CM | POA: Insufficient documentation

## 2012-03-11 DIAGNOSIS — Z9089 Acquired absence of other organs: Secondary | ICD-10-CM | POA: Insufficient documentation

## 2012-03-11 DIAGNOSIS — E119 Type 2 diabetes mellitus without complications: Secondary | ICD-10-CM | POA: Insufficient documentation

## 2012-03-11 DIAGNOSIS — Z203 Contact with and (suspected) exposure to rabies: Secondary | ICD-10-CM | POA: Insufficient documentation

## 2012-03-11 DIAGNOSIS — E785 Hyperlipidemia, unspecified: Secondary | ICD-10-CM | POA: Insufficient documentation

## 2012-03-11 DIAGNOSIS — Z7982 Long term (current) use of aspirin: Secondary | ICD-10-CM | POA: Insufficient documentation

## 2012-03-11 DIAGNOSIS — Z79899 Other long term (current) drug therapy: Secondary | ICD-10-CM | POA: Insufficient documentation

## 2012-03-11 MED ORDER — RABIES IMMUNE GLOBULIN 150 UNIT/ML IM INJ
20.0000 [IU]/kg | INJECTION | Freq: Once | INTRAMUSCULAR | Status: AC
  Administered 2012-03-11: 2100 [IU] via INTRAMUSCULAR
  Filled 2012-03-11: qty 14

## 2012-03-11 MED ORDER — RABIES VACCINE, PCEC IM SUSR
1.0000 mL | Freq: Once | INTRAMUSCULAR | Status: AC
  Administered 2012-03-11: 1 mL via INTRAMUSCULAR
  Filled 2012-03-11: qty 1

## 2012-03-11 NOTE — ED Notes (Signed)
2 ml of Hyperab admin in L deltoid

## 2012-03-11 NOTE — ED Provider Notes (Signed)
History  This chart was scribed for Shelda Jakes, MD by Shari Heritage. The patient was seen in room TR08C/TR08C. Patient's care was started at 1405.     CSN: [id number]  Arrival date & time 03/11/12  1405   First MD Initiated Contact with Patient 03/11/12 1538      Chief Complaint  Patient presents with  . Rabies Injection    (Consider location/radiation/quality/duration/timing/severity/associated sxs/prior treatment) The history is provided by the patient. No language interpreter was used.  \  Richard Bradshaw is a 61 y.o. male who presents to the Emergency Department complaining of complaining of possible rabies exposure last Thursday. Patient says he discovered a bat in his and his wife's bedroom 5 days ago. Patient says that they couldn't catch the bat. A few days later, they discovered at open window in their home and think that the bat both entered and exited through this window. They didn't have the opportunity to get the bat tested. They were referred to the ED by [facility]. Patient denies fever, cough, congestion, chest pain or SOB. No abdominal pain, nausea, vomiting or diarrhea. He has a medical history of diabetes, hyperlipidemia, obstructive sleep apnea, HTN, esophageal reflux, neuritis, neuralgia, and prostate caner. He is a former smoker.    Past Medical History  Diagnosis Date  . Diabetes mellitus     type 2  . 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 Hyperlipidemia   . Obesity   . Anxiety   . Adult onset fluency disorder   . Depressive disorder   . Obstructive sleep apnea     on CPAP  . Cerebellar ataxia   . Essential hypertension   . Angina pectoris     s/p cath 09/2011 with mild CAD, normal LV function and normal right heart pressures.   . Varicose vein   . Sinusitis   . Esophageal reflux   . Hypertrophy (benign) of prostate   . Hyperplasia of prostate   . Monoarthritis     lower leg  . Neuralgia   .  Neuritis   . Radiculitis   . Insomnia   . Fatigue   . SOB (shortness of breath)     chronic  . Prostate cancer   . Dizziness   . Pulmonary HTN     not noted on cath 09/2011; Had normal right heart pressures  . Morbid obesity     Past Surgical History  Procedure Date  . Appendectomy   . Tonsillectomy   . Cholecystectomy 2008  . Colonoscopy 06/13/2004  . Other surgical history 01/05/2004    ETT--Nuclear  . Atrial fibrillation ablation 2006    x2    Family History  Problem Relation Age of Onset  . Hypertension Mother   . Diabetes Mother   . Cancer Father   . Hypertension Sister     History  Substance Use Topics  . Smoking status: Former Smoker    Quit date: 08/15/1978  . Smokeless tobacco: Not on file  . Alcohol Use: No      Review of Systems  Constitutional: Negative for fever.  HENT: Negative for congestion.   Respiratory: Negative for cough and shortness of breath.   Cardiovascular: Negative for chest pain.  Gastrointestinal: Negative for nausea, vomiting, abdominal pain and diarrhea.    Allergies  Penicillins  Home Medications   Current Outpatient Rx  Name Route Sig Dispense Refill  . ASPIRIN 81 MG PO TABS Oral Take 81 mg by mouth daily.     Marland Kitchen  VITAMIN D 1000 UNITS PO TABS Oral Take 1,000 Units by mouth daily.    Marland Kitchen DICYCLOMINE HCL 10 MG PO CAPS Oral Take 10 mg by mouth daily.     . DULOXETINE HCL 60 MG PO CPEP Oral Take 60 mg by mouth daily.    Marland Kitchen EPIPEN IJ Injection Inject as directed. 0.3 mg     . ESOMEPRAZOLE MAGNESIUM 40 MG PO CPDR Oral Take 40 mg by mouth daily before breakfast.    . FUROSEMIDE 20 MG PO TABS Oral Take 20 mg by mouth every evening.     Marland Kitchen GLIMEPIRIDE 1 MG PO TABS Oral Take 1 mg by mouth 2 (two) times daily.    Marland Kitchen FREESTYLE LITE TEST VI In Vitro by In Vitro route.    Marland Kitchen KETOCONAZOLE 2 % EX CREA Topical Apply 1 application topically daily. To spot on arm    . LOSARTAN POTASSIUM-HCTZ 100-25 MG PO TABS Oral Take 1 tablet by mouth daily. 30  tablet 11  . MAGNESIUM CHLORIDE ER 535 (64 MG) MG PO TBCR Oral Take 1 tablet by mouth every morning.    Marland Kitchen NITROGLYCERIN 0.4 MG SL SUBL Sublingual Place 0.4 mg under the tongue every 5 (five) minutes as needed.      BP 145/96  Pulse 90  Temp 98.2 F (36.8 C) (Oral)  Resp 20  Ht 5\' 9"  (1.753 m)  Wt 235 lb (106.595 kg)  BMI 34.70 kg/m2  SpO2 96%  Physical Exam  Constitutional: He is oriented to person, place, and time.  Cardiovascular: Normal rate and regular rhythm.   No murmur heard. Pulmonary/Chest: Effort normal and breath sounds normal. No respiratory distress. He has no wheezes. He has no rales.  Abdominal: Soft. Bowel sounds are normal. There is no tenderness.  Neurological: He is alert and oriented to person, place, and time. He has normal reflexes. No sensory deficit. He exhibits normal muscle tone. Coordination normal.    ED Course  Procedures (including critical care time) DIAGNOSTIC STUDIES: Oxygen Saturation is 94% on room air, adequate by my interpretation.    COORDINATION OF CARE: 4:00pm- Patient informed of current plan for treatment and evaluation and agrees with plan at this time. Explained at length the rabies vaccination process.   Labs Reviewed - No data to display No results found.   1. Rabies exposure       MDM  Patient with exposure to a bat in the house no bite. However rabies immunization process is appropriate. Patient received the rabies immune globulin him here today and received first vaccination.     I personally performed the services described in this documentation, which was scribed in my presence. The recorded information has been reviewed and considered.     Shelda Jakes, MD 03/11/12 [phone]

## 2012-03-11 NOTE — ED Notes (Signed)
To ED after having a bat in bedroom a cple of nights ago. Told to come to ED for eval

## 2012-03-14 ENCOUNTER — Emergency Department (INDEPENDENT_AMBULATORY_CARE_PROVIDER_SITE_OTHER)
Admission: EM | Admit: 2012-03-14 | Discharge: 2012-03-14 | Disposition: A | Discharge status: Home / Self Care | Payer: Medicare Other | Source: Home / Self Care

## 2012-03-14 ENCOUNTER — Encounter (HOSPITAL_COMMUNITY): Payer: Self-pay | Admitting: *Deleted

## 2012-03-14 DIAGNOSIS — Z23 Encounter for immunization: Secondary | ICD-10-CM

## 2012-03-14 MED ORDER — RABIES VACCINE, PCEC IM SUSR
1.0000 mL | Freq: Once | INTRAMUSCULAR | Status: AC
  Administered 2012-03-14: 1 mL via INTRAMUSCULAR

## 2012-03-14 MED ORDER — RABIES VACCINE, PCEC IM SUSR
INTRAMUSCULAR | Status: AC
  Filled 2012-03-14: qty 1

## 2012-03-14 NOTE — ED Notes (Signed)
Rabies schedule completed and faxed to [facility] and pharmacy x 2. 

## 2012-03-14 NOTE — ED Notes (Signed)
Here for 2nd rabies vaccine for bat exposure.

## 2012-03-18 ENCOUNTER — Emergency Department (INDEPENDENT_AMBULATORY_CARE_PROVIDER_SITE_OTHER)
Admission: EM | Admit: 2012-03-18 | Discharge: 2012-03-18 | Disposition: A | Discharge status: Home / Self Care | Payer: Medicare Other | Source: Home / Self Care | Attending: Emergency Medicine | Admitting: Emergency Medicine

## 2012-03-18 ENCOUNTER — Encounter (HOSPITAL_COMMUNITY): Payer: Self-pay | Admitting: *Deleted

## 2012-03-18 DIAGNOSIS — Z23 Encounter for immunization: Secondary | ICD-10-CM

## 2012-03-18 MED ORDER — RABIES VACCINE, PCEC IM SUSR
INTRAMUSCULAR | Status: AC
  Filled 2012-03-18: qty 1

## 2012-03-18 MED ORDER — RABIES VACCINE, PCEC IM SUSR
1.0000 mL | Freq: Once | INTRAMUSCULAR | Status: AC
  Administered 2012-03-18: 1 mL via INTRAMUSCULAR

## 2012-03-18 NOTE — ED Notes (Signed)
Here for 3 rd rabies vaccine for bat exposure. C/o feeling tired for 3 days after last shot. 

## 2012-03-25 ENCOUNTER — Encounter (HOSPITAL_COMMUNITY): Payer: Self-pay | Admitting: *Deleted

## 2012-03-25 ENCOUNTER — Emergency Department (INDEPENDENT_AMBULATORY_CARE_PROVIDER_SITE_OTHER)
Admission: EM | Admit: 2012-03-25 | Discharge: 2012-03-25 | Disposition: A | Discharge status: Home / Self Care | Payer: Medicare Other | Source: Home / Self Care

## 2012-03-25 DIAGNOSIS — Z23 Encounter for immunization: Secondary | ICD-10-CM

## 2012-03-25 MED ORDER — RABIES VACCINE, PCEC IM SUSR
INTRAMUSCULAR | Status: AC
  Filled 2012-03-25: qty 1

## 2012-03-25 MED ORDER — RABIES VACCINE, PCEC IM SUSR
1.0000 mL | Freq: Once | INTRAMUSCULAR | Status: AC
  Administered 2012-03-25: 1 mL via INTRAMUSCULAR

## 2012-03-25 NOTE — ED Notes (Addendum)
Pt. was given Imovax from [facility]. on 8/13. Not [employer]. Exp. Date correct.

## 2012-03-25 NOTE — ED Notes (Signed)
Here for 4th rabies vaccine for bat exposure.  States he feels tired for a few days after the injection.

## 2012-04-23 IMAGING — CR DG CHEST 2V
2 series · 2 of 2 positions shown · non-contrast
Comparison: PA and lateral chest 09/18/2010.

CLINICAL DATA: Chest pain.

CHEST - 2 VIEW

[view not recorded (1 of 2)]
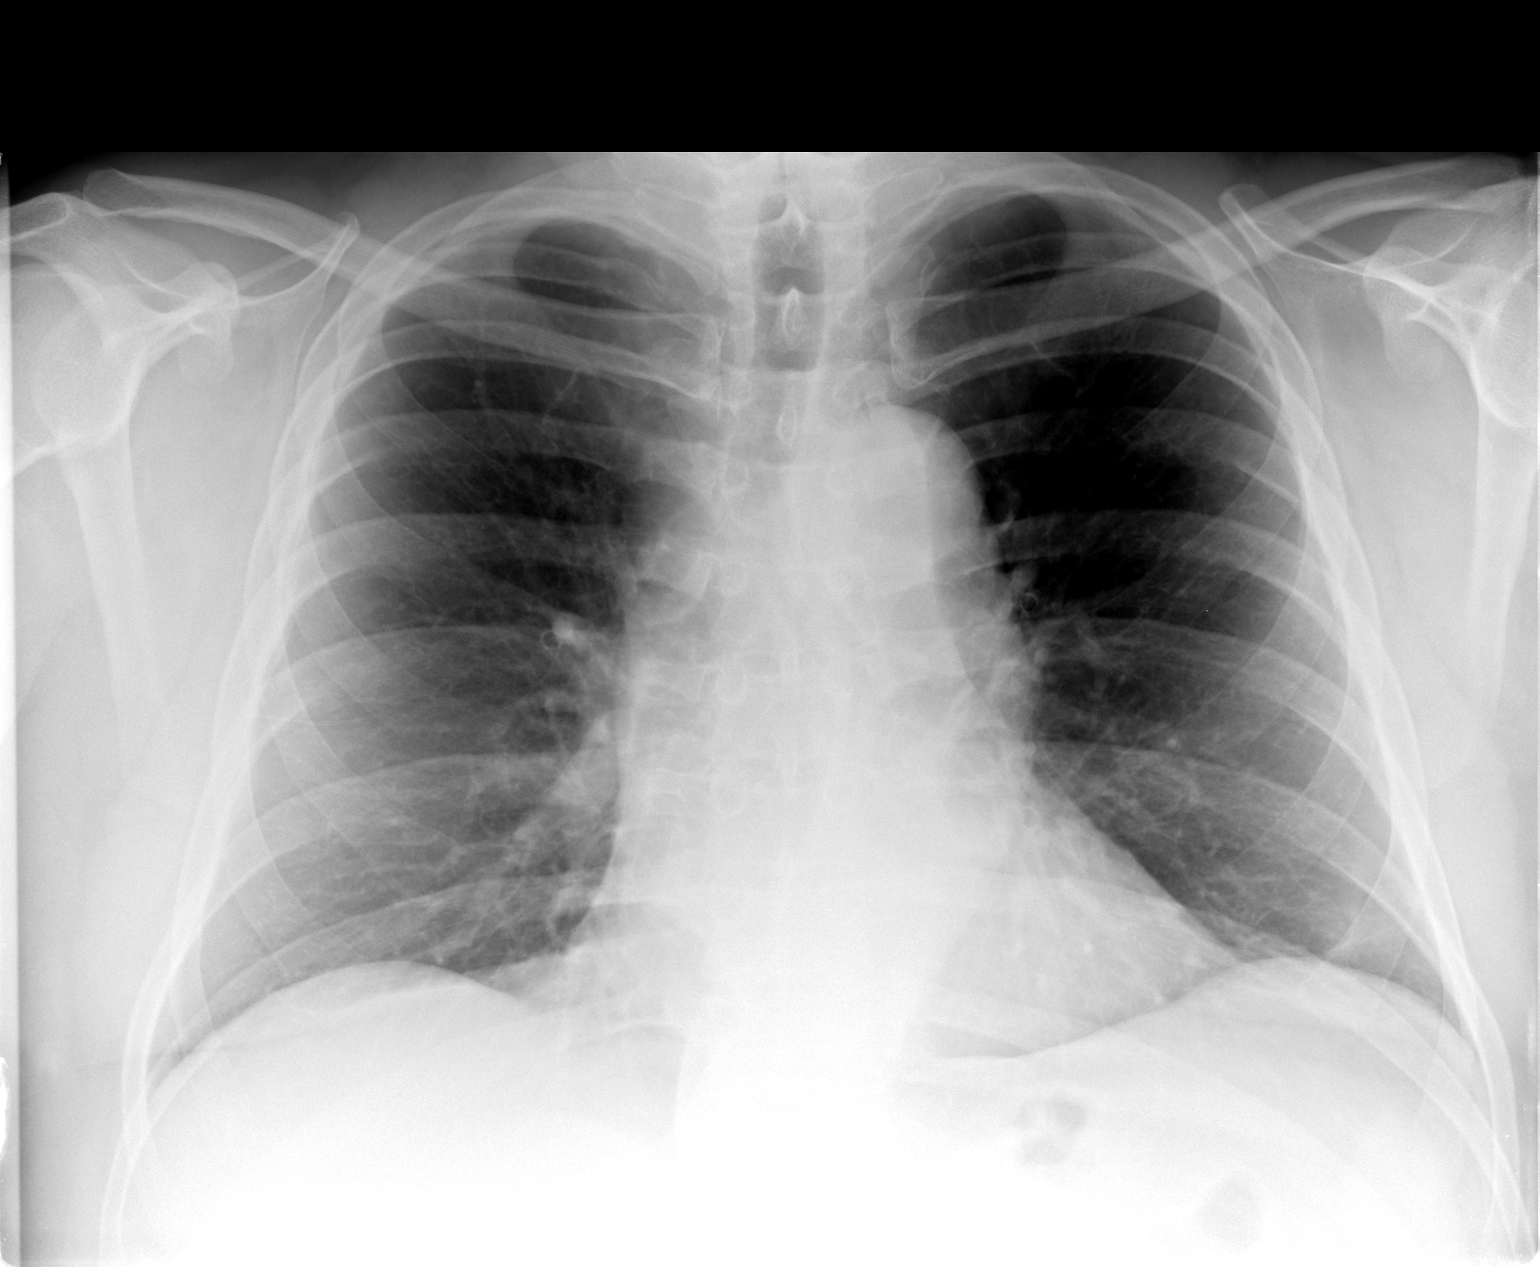

[view not recorded (2 of 2)]
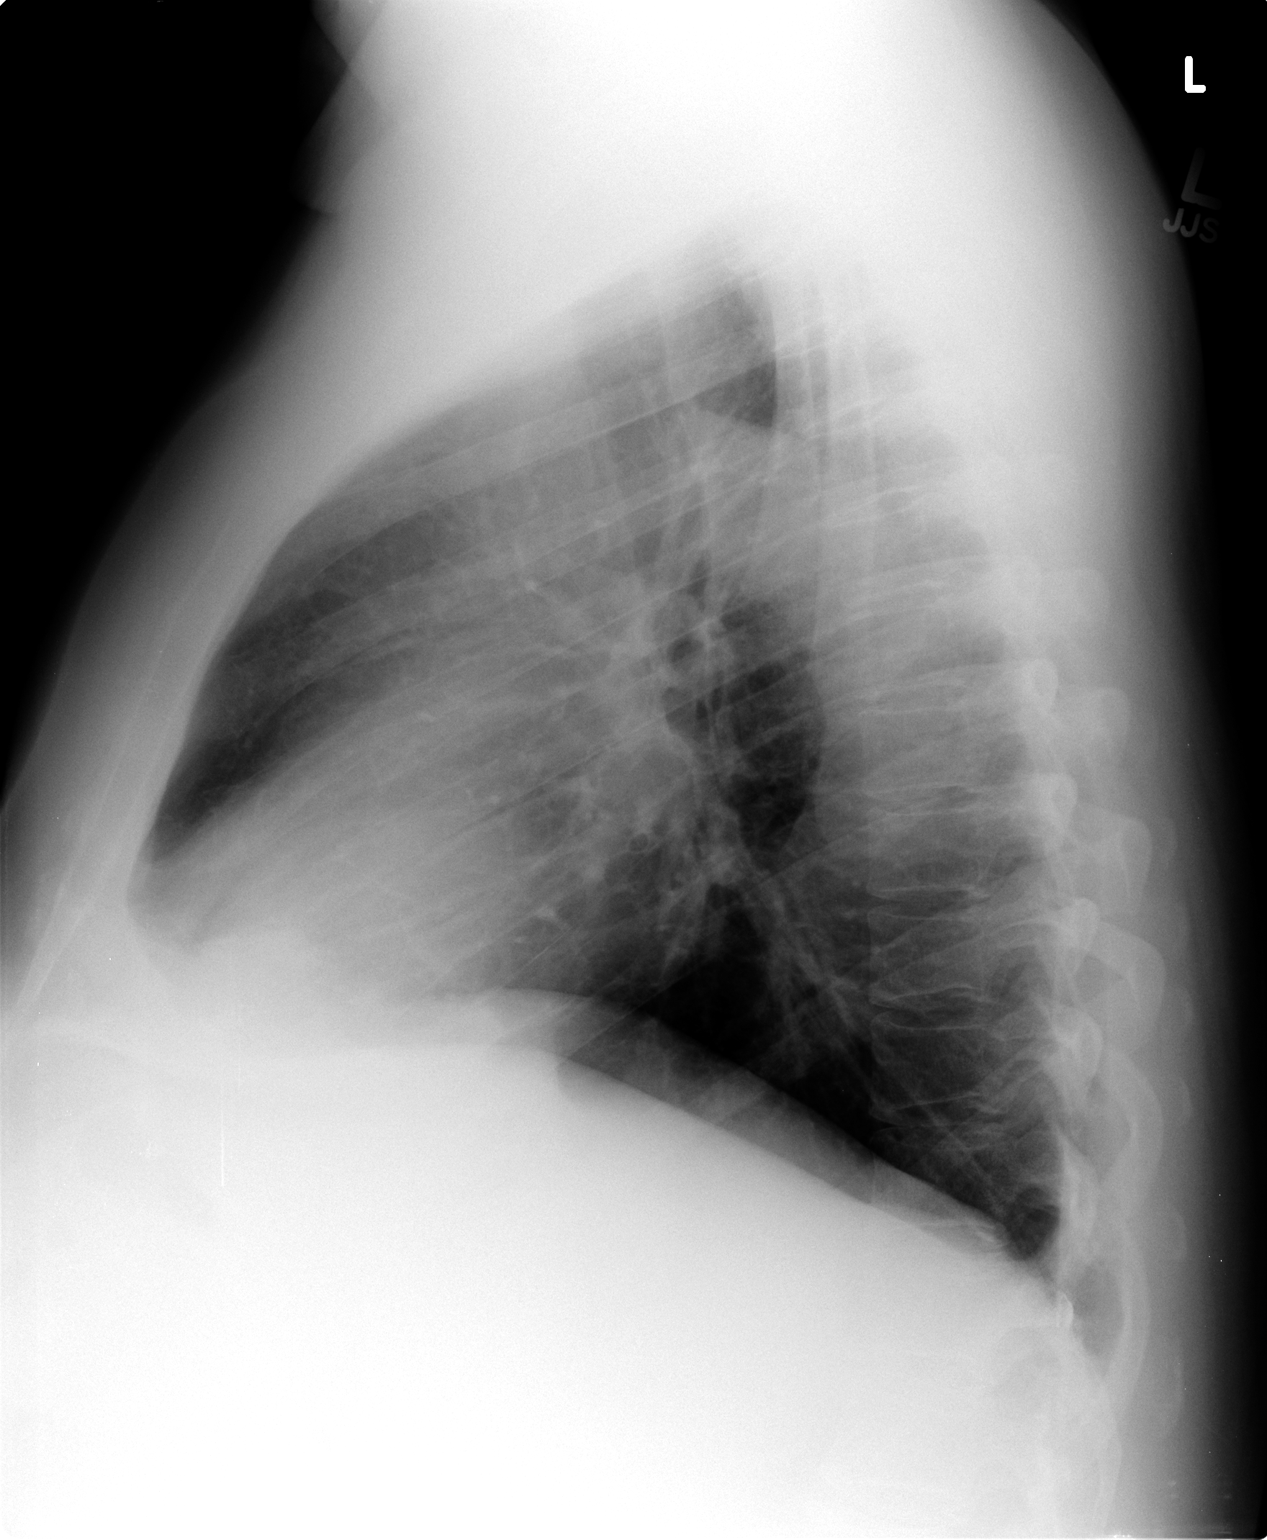

[2 of 2 positions shown; findings below may reference images not displayed]

FINDINGS: Lungs are clear.  Heart size is normal.  No pneumothorax
or effusion.
IMPRESSION: No acute disease.

## 2012-06-30 ENCOUNTER — Encounter (HOSPITAL_COMMUNITY): Payer: Self-pay | Admitting: *Deleted

## 2012-06-30 ENCOUNTER — Emergency Department (INDEPENDENT_AMBULATORY_CARE_PROVIDER_SITE_OTHER)
Admission: EM | Admit: 2012-06-30 | Discharge: 2012-06-30 | Disposition: A | Discharge status: Home / Self Care | Payer: Medicare Other | Source: Home / Self Care | Attending: Family Medicine | Admitting: Family Medicine

## 2012-06-30 DIAGNOSIS — I10 Essential (primary) hypertension: Secondary | ICD-10-CM

## 2012-06-30 LAB — POCT I-STAT, CHEM 8
BUN: 19 mg/dL (ref 6–23)
Chloride: 99 mEq/L (ref 96–112)
Creatinine, Ser: 1.1 mg/dL (ref 0.50–1.35)
Hemoglobin: 16.3 g/dL (ref 13.0–17.0)
Potassium: 4.3 mEq/L (ref 3.5–5.1)
Sodium: 137 mEq/L (ref 135–145)

## 2012-06-30 MED ORDER — AMLODIPINE BESYLATE 5 MG PO TABS
5.0000 mg | ORAL_TABLET | Freq: Every day | ORAL | Status: AC
Start: 2012-06-30 — End: ?

## 2012-06-30 NOTE — ED Provider Notes (Signed)
History     CSN: [id number]  Arrival date & time 06/30/12  1546   First MD Initiated Contact with Patient 06/30/12 1552      Chief Complaint  Patient presents with  . Hypertension    (Consider location/radiation/quality/duration/timing/severity/associated sxs/prior treatment) Patient is a 61 y.o. male presenting with hypertension. The history is provided by the patient.  Hypertension This is a chronic problem. The current episode started more than 2 days ago. The problem has been gradually improving. Associated symptoms include headaches. Pertinent negatives include no chest pain, no abdominal pain and no shortness of breath. The symptoms are aggravated by stress.    Past Medical History  Diagnosis Date  . Diabetes mellitus     type 2  . 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 Hyperlipidemia   . Obesity   . Anxiety   . Adult onset fluency disorder   . Depressive disorder   . Obstructive sleep apnea     on CPAP  . Cerebellar ataxia   . Essential hypertension   . Angina pectoris     s/p cath 09/2011 with mild CAD, normal LV function and normal right heart pressures.   . Varicose vein   . Sinusitis   . Esophageal reflux   . Hypertrophy (benign) of prostate   . Hyperplasia of prostate   . Monoarthritis     lower leg  . Neuralgia   . Neuritis   . Radiculitis   . Insomnia   . Fatigue   . SOB (shortness of breath)     chronic  . Prostate cancer   . Dizziness   . Pulmonary HTN     not noted on cath 09/2011; Had normal right heart pressures  . Morbid obesity     Past Surgical History  Procedure Date  . Appendectomy   . Tonsillectomy   . Cholecystectomy 2008  . Colonoscopy 06/13/2004  . Other surgical history 01/05/2004    ETT--Nuclear  . Atrial fibrillation ablation 2006    x2  . Tonsillectomy   . Vascular surgery     vericose vein removed    Family History  Problem Relation Age of Onset  . Hypertension Mother   .  Diabetes Mother   . Heart disease Mother   . Cancer Father   . Hypertension Sister     History  Substance Use Topics  . Smoking status: Former Smoker    Quit date: 08/15/1978  . Smokeless tobacco: Not on file  . Alcohol Use: No      Review of Systems  Constitutional: Negative.   Respiratory: Negative for chest tightness, shortness of breath and wheezing.   Cardiovascular: Negative for chest pain and palpitations.  Gastrointestinal: Negative.  Negative for abdominal pain.  Neurological: Positive for headaches.    Allergies  Penicillins  Home Medications   Current Outpatient Rx  Name  Route  Sig  Dispense  Refill  . AMLODIPINE BESYLATE 5 MG PO TABS   Oral   Take 1 tablet (5 mg total) by mouth daily.   30 tablet   1   . ASPIRIN 81 MG PO TABS   Oral   Take 81 mg by mouth daily.          Marland Kitchen VITAMIN D 1000 UNITS PO TABS   Oral   Take 1,000 Units by mouth daily.         Marland Kitchen DICYCLOMINE HCL 10 MG PO CAPS   Oral   Take 10 mg  by mouth daily.          . DULOXETINE HCL 60 MG PO CPEP   Oral   Take 60 mg by mouth daily.         Marland Kitchen EPIPEN IJ   Injection   Inject as directed. 0.3 mg          . ESOMEPRAZOLE MAGNESIUM 40 MG PO CPDR   Oral   Take 40 mg by mouth daily before breakfast.         . FUROSEMIDE 20 MG PO TABS   Oral   Take 20 mg by mouth 2 (two) times daily.          Marland Kitchen GLIMEPIRIDE 1 MG PO TABS   Oral   Take 1 mg by mouth 2 (two) times daily.         Marland Kitchen FREESTYLE LITE TEST VI   In Vitro   by In Vitro route.         Marland Kitchen KETOCONAZOLE 2 % EX CREA   Topical   Apply 1 application topically daily. To spot on arm         . LOSARTAN POTASSIUM-HCTZ 100-25 MG PO TABS   Oral   Take 1 tablet by mouth daily.   30 tablet   11   . MAGNESIUM CHLORIDE ER 535 (64 MG) MG PO TBCR   Oral   Take 1 tablet by mouth every morning.         Marland Kitchen NITROGLYCERIN 0.4 MG SL SUBL   Sublingual   Place 0.4 mg under the tongue every 5 (five) minutes as needed.            BP 143/94  Pulse 97  Temp 98.5 F (36.9 C) (Oral)  Resp 16  SpO2 99%  Physical Exam  Nursing note and vitals reviewed. Constitutional: He is oriented to person, place, and time. He appears well-developed and well-nourished. No distress.  HENT:  Head: Normocephalic.  Right Ear: External ear normal.  Left Ear: External ear normal.  Mouth/Throat: Oropharynx is clear and moist.  Eyes: Conjunctivae normal are normal. Pupils are equal, round, and reactive to light.  Neck: Normal range of motion. Neck supple.  Cardiovascular: Normal rate, regular rhythm, normal heart sounds and intact distal pulses.   Pulmonary/Chest: Effort normal and breath sounds normal.  Abdominal: Soft. Bowel sounds are normal.  Lymphadenopathy:    He has no cervical adenopathy.  Neurological: He is alert and oriented to person, place, and time.  Skin: Skin is warm and dry.    ED Course  Procedures (including critical care time)  Labs Reviewed  POCT I-STAT, CHEM 8 - Abnormal; Notable for the following:    Glucose, Bld 164 (*)     Calcium, Ion 1.10 (*)     All other components within normal limits   No results found.   1. HBP (high blood pressure)       MDM  i-stat  wnl ex bs 164        Linna Hoff, MD 06/30/12 1722

## 2012-06-30 NOTE — ED Notes (Signed)
Pt         Reports  For  The  Last  sev  Days         His  Blood  Pressure  Has  Been  Elevated  Hand  He  Has  Had  A  headcahe   And  Some l  Arm  Pain  He  Reports  He  Has  Been  Taking his  meds               And  Actually  Took an  Extra  One  Yesterday     -       He  States  He  Has  Been  Under  Some  Extra  Stress  Lately  And  Has  Not been  Eating  Right

## 2015-09-13 ENCOUNTER — Encounter (HOSPITAL_BASED_OUTPATIENT_CLINIC_OR_DEPARTMENT_OTHER): Payer: Self-pay | Admitting: *Deleted

## 2015-09-13 ENCOUNTER — Other Ambulatory Visit: Payer: Self-pay | Admitting: Urology

## 2015-09-13 NOTE — Progress Notes (Signed)
Hx obtained from wife per pt's request.  Instructions given for pt to be NPO p MN 02/14, including no tobacco, x flomax, ditropan, norvasc, effexor, bentyl, nexium w sip of water.  Ok to take pain med w sip of water if needed.  To [facility] 2/15 @ 0700.  Needs istat, ekg on arrival.  Pt to bring his bipap DOS and leave in car.

## 2015-09-21 ENCOUNTER — Ambulatory Visit (HOSPITAL_BASED_OUTPATIENT_CLINIC_OR_DEPARTMENT_OTHER): Payer: Medicare Other | Admitting: Anesthesiology

## 2015-09-21 ENCOUNTER — Encounter (HOSPITAL_BASED_OUTPATIENT_CLINIC_OR_DEPARTMENT_OTHER)
Admission: RE | Disposition: A | Discharge status: Home / Self Care | Payer: Self-pay | Source: Ambulatory Visit | Attending: Urology

## 2015-09-21 ENCOUNTER — Ambulatory Visit (HOSPITAL_BASED_OUTPATIENT_CLINIC_OR_DEPARTMENT_OTHER)
Admission: RE | Admit: 2015-09-21 | Discharge: 2015-09-21 | Disposition: A | Discharge status: Home / Self Care | Payer: Medicare Other | Source: Ambulatory Visit | Attending: Urology | Admitting: Urology

## 2015-09-21 ENCOUNTER — Other Ambulatory Visit: Payer: Self-pay

## 2015-09-21 ENCOUNTER — Encounter (HOSPITAL_BASED_OUTPATIENT_CLINIC_OR_DEPARTMENT_OTHER): Payer: Self-pay | Admitting: *Deleted

## 2015-09-21 DIAGNOSIS — M549 Dorsalgia, unspecified: Secondary | ICD-10-CM | POA: Insufficient documentation

## 2015-09-21 DIAGNOSIS — I2510 Atherosclerotic heart disease of native coronary artery without angina pectoris: Secondary | ICD-10-CM | POA: Insufficient documentation

## 2015-09-21 DIAGNOSIS — I4891 Unspecified atrial fibrillation: Secondary | ICD-10-CM | POA: Insufficient documentation

## 2015-09-21 DIAGNOSIS — G119 Hereditary ataxia, unspecified: Secondary | ICD-10-CM | POA: Diagnosis not present

## 2015-09-21 DIAGNOSIS — E785 Hyperlipidemia, unspecified: Secondary | ICD-10-CM | POA: Insufficient documentation

## 2015-09-21 DIAGNOSIS — N454 Abscess of epididymis or testis: Secondary | ICD-10-CM | POA: Insufficient documentation

## 2015-09-21 DIAGNOSIS — I10 Essential (primary) hypertension: Secondary | ICD-10-CM | POA: Insufficient documentation

## 2015-09-21 DIAGNOSIS — Z9989 Dependence on other enabling machines and devices: Secondary | ICD-10-CM | POA: Diagnosis not present

## 2015-09-21 DIAGNOSIS — Z7984 Long term (current) use of oral hypoglycemic drugs: Secondary | ICD-10-CM | POA: Diagnosis not present

## 2015-09-21 DIAGNOSIS — E669 Obesity, unspecified: Secondary | ICD-10-CM | POA: Diagnosis not present

## 2015-09-21 DIAGNOSIS — E119 Type 2 diabetes mellitus without complications: Secondary | ICD-10-CM | POA: Insufficient documentation

## 2015-09-21 DIAGNOSIS — Z8546 Personal history of malignant neoplasm of prostate: Secondary | ICD-10-CM | POA: Diagnosis not present

## 2015-09-21 DIAGNOSIS — N400 Enlarged prostate without lower urinary tract symptoms: Secondary | ICD-10-CM | POA: Insufficient documentation

## 2015-09-21 DIAGNOSIS — G8929 Other chronic pain: Secondary | ICD-10-CM | POA: Diagnosis not present

## 2015-09-21 DIAGNOSIS — N452 Orchitis: Secondary | ICD-10-CM | POA: Insufficient documentation

## 2015-09-21 DIAGNOSIS — K219 Gastro-esophageal reflux disease without esophagitis: Secondary | ICD-10-CM | POA: Diagnosis not present

## 2015-09-21 DIAGNOSIS — G4733 Obstructive sleep apnea (adult) (pediatric): Secondary | ICD-10-CM | POA: Insufficient documentation

## 2015-09-21 DIAGNOSIS — F17220 Nicotine dependence, chewing tobacco, uncomplicated: Secondary | ICD-10-CM | POA: Insufficient documentation

## 2015-09-21 DIAGNOSIS — N50811 Right testicular pain: Secondary | ICD-10-CM | POA: Diagnosis present

## 2015-09-21 DIAGNOSIS — Z6837 Body mass index (BMI) 37.0-37.9, adult: Secondary | ICD-10-CM | POA: Insufficient documentation

## 2015-09-21 HISTORY — PX: ORCHIECTOMY: SHX2116

## 2015-09-21 HISTORY — DX: Other specified disorders of the male genital organs: N50.89

## 2015-09-21 HISTORY — DX: Cardiac arrhythmia, unspecified: I49.9

## 2015-09-21 LAB — POCT I-STAT 4, (NA,K, GLUC, HGB,HCT)
GLUCOSE: 199 mg/dL — AB (ref 65–99)
HEMATOCRIT: 43 % (ref 39.0–52.0)
Hemoglobin: 14.6 g/dL (ref 13.0–17.0)
POTASSIUM: 3.8 mmol/L (ref 3.5–5.1)
Sodium: 141 mmol/L (ref 135–145)

## 2015-09-21 LAB — GLUCOSE, CAPILLARY: GLUCOSE-CAPILLARY: 157 mg/dL — AB (ref 65–99)

## 2015-09-21 SURGERY — ORCHIECTOMY
Anesthesia: General | Laterality: Right

## 2015-09-21 MED ORDER — FENTANYL CITRATE (PF) 100 MCG/2ML IJ SOLN
INTRAMUSCULAR | Status: AC
  Filled 2015-09-21: qty 2

## 2015-09-21 MED ORDER — CLINDAMYCIN PHOSPHATE 900 MG/50ML IV SOLN
INTRAVENOUS | Status: AC
  Filled 2015-09-21: qty 50

## 2015-09-21 MED ORDER — CLINDAMYCIN PHOSPHATE 900 MG/50ML IV SOLN
900.0000 mg | INTRAVENOUS | Status: AC
  Administered 2015-09-21: 900 mg via INTRAVENOUS
  Filled 2015-09-21: qty 50

## 2015-09-21 MED ORDER — 0.9 % SODIUM CHLORIDE (POUR BTL) OPTIME
TOPICAL | Status: DC | PRN
  Administered 2015-09-21: 500 mL

## 2015-09-21 MED ORDER — WHITE PETROLATUM GEL
Status: AC
  Filled 2015-09-21: qty 5

## 2015-09-21 MED ORDER — FENTANYL CITRATE (PF) 100 MCG/2ML IJ SOLN
25.0000 ug | INTRAMUSCULAR | Status: DC | PRN
  Administered 2015-09-21: 25 ug via INTRAVENOUS
  Filled 2015-09-21: qty 1

## 2015-09-21 MED ORDER — ONDANSETRON HCL 4 MG/2ML IJ SOLN
INTRAMUSCULAR | Status: DC | PRN
Start: 2015-09-21 — End: 2015-09-21
  Administered 2015-09-21: 4 mg via INTRAVENOUS

## 2015-09-21 MED ORDER — OXYCODONE-ACETAMINOPHEN 5-325 MG PO TABS: 1.0000 | ORAL_TABLET | Freq: Four times a day (QID) | ORAL | Status: AC | PRN

## 2015-09-21 MED ORDER — CIPROFLOXACIN IN D5W 400 MG/200ML IV SOLN
400.0000 mg | INTRAVENOUS | Status: DC
  Filled 2015-09-21: qty 200

## 2015-09-21 MED ORDER — ONDANSETRON HCL 4 MG/2ML IJ SOLN
INTRAMUSCULAR | Status: AC
  Filled 2015-09-21: qty 2

## 2015-09-21 MED ORDER — FENTANYL CITRATE (PF) 100 MCG/2ML IJ SOLN
INTRAMUSCULAR | Status: DC | PRN
  Administered 2015-09-21 (×2): 50 ug via INTRAVENOUS
  Administered 2015-09-21: 25 ug via INTRAVENOUS
  Administered 2015-09-21: 50 ug via INTRAVENOUS
  Administered 2015-09-21: 25 ug via INTRAVENOUS

## 2015-09-21 MED ORDER — MIDAZOLAM HCL 5 MG/5ML IJ SOLN
INTRAMUSCULAR | Status: DC | PRN
  Administered 2015-09-21 (×2): 1 mg via INTRAVENOUS

## 2015-09-21 MED ORDER — LIDOCAINE HCL (CARDIAC) 20 MG/ML IV SOLN
INTRAVENOUS | Status: AC
  Filled 2015-09-21: qty 5

## 2015-09-21 MED ORDER — MEPERIDINE HCL 25 MG/ML IJ SOLN
6.2500 mg | INTRAMUSCULAR | Status: DC | PRN
  Filled 2015-09-21: qty 1

## 2015-09-21 MED ORDER — PROPOFOL 10 MG/ML IV BOLUS
INTRAVENOUS | Status: DC | PRN
  Administered 2015-09-21: 200 mg via INTRAVENOUS

## 2015-09-21 MED ORDER — LACTATED RINGERS IV SOLN
INTRAVENOUS | Status: DC
  Administered 2015-09-21 (×2): via INTRAVENOUS
  Filled 2015-09-21: qty 1000

## 2015-09-21 MED ORDER — OXYCODONE-ACETAMINOPHEN 5-325 MG PO TABS
1.0000 | ORAL_TABLET | Freq: Four times a day (QID) | ORAL | Status: DC | PRN
  Administered 2015-09-21: 1 via ORAL
  Filled 2015-09-21: qty 2

## 2015-09-21 MED ORDER — LIDOCAINE HCL (CARDIAC) 20 MG/ML IV SOLN
INTRAVENOUS | Status: DC | PRN
  Administered 2015-09-21: 40 mg via INTRAVENOUS

## 2015-09-21 MED ORDER — MIDAZOLAM HCL 2 MG/2ML IJ SOLN
INTRAMUSCULAR | Status: AC
  Filled 2015-09-21: qty 2

## 2015-09-21 MED ORDER — MIDAZOLAM HCL 2 MG/2ML IJ SOLN
0.5000 mg | Freq: Once | INTRAMUSCULAR | Status: DC | PRN
  Filled 2015-09-21: qty 2

## 2015-09-21 MED ORDER — SENNOSIDES-DOCUSATE SODIUM 8.6-50 MG PO TABS: 1.0000 | ORAL_TABLET | Freq: Two times a day (BID) | ORAL | Status: AC

## 2015-09-21 MED ORDER — BUPIVACAINE HCL (PF) 0.25 % IJ SOLN
INTRAMUSCULAR | Status: DC | PRN
  Administered 2015-09-21: 10 mL

## 2015-09-21 MED ORDER — OXYCODONE-ACETAMINOPHEN 5-325 MG PO TABS
ORAL_TABLET | ORAL | Status: AC
  Filled 2015-09-21: qty 1

## 2015-09-21 MED ORDER — PROMETHAZINE HCL 25 MG/ML IJ SOLN
6.2500 mg | INTRAMUSCULAR | Status: DC | PRN
  Filled 2015-09-21: qty 1

## 2015-09-21 MED ORDER — CIPROFLOXACIN IN D5W 400 MG/200ML IV SOLN
INTRAVENOUS | Status: AC
  Filled 2015-09-21: qty 200

## 2015-09-21 MED ORDER — PROPOFOL 10 MG/ML IV BOLUS
INTRAVENOUS | Status: AC
  Filled 2015-09-21: qty 20

## 2015-09-21 SURGICAL SUPPLY — 37 items
APPLICATOR COTTON TIP 6IN STRL (MISCELLANEOUS) IMPLANT
BLADE CLIPPER SURG (BLADE) ×3 IMPLANT
BLADE SURG 15 STRL LF DISP TIS (BLADE) ×1 IMPLANT
BLADE SURG 15 STRL SS (BLADE) ×2
BNDG GAUZE ELAST 4 BULKY (GAUZE/BANDAGES/DRESSINGS) ×3 IMPLANT
CANISTER SUCTION 2500CC (MISCELLANEOUS) ×3 IMPLANT
COVER BACK TABLE 60X90IN (DRAPES) ×3 IMPLANT
COVER MAYO STAND STRL (DRAPES) ×3 IMPLANT
DISSECTOR ROUND CHERRY 3/8 STR (MISCELLANEOUS) ×3 IMPLANT
DRAIN PENROSE 18X1/2 LTX STRL (DRAIN) ×3 IMPLANT
DRAPE LAPAROTOMY 100X72 PEDS (DRAPES) ×3 IMPLANT
ELECT NEEDLE TIP 2.8 STRL (NEEDLE) ×3 IMPLANT
ELECT REM PT RETURN 9FT ADLT (ELECTROSURGICAL) ×3
ELECTRODE REM PT RTRN 9FT ADLT (ELECTROSURGICAL) ×1 IMPLANT
GLOVE BIO SURGEON STRL SZ7.5 (GLOVE) ×3 IMPLANT
GOWN STRL REUS W/ TWL LRG LVL3 (GOWN DISPOSABLE) ×1 IMPLANT
GOWN STRL REUS W/ TWL XL LVL3 (GOWN DISPOSABLE) ×1 IMPLANT
GOWN STRL REUS W/TWL LRG LVL3 (GOWN DISPOSABLE) ×2
GOWN STRL REUS W/TWL XL LVL3 (GOWN DISPOSABLE) ×2
KIT ROOM TURNOVER WOR (KITS) ×3 IMPLANT
LIQUID BAND (GAUZE/BANDAGES/DRESSINGS) ×3 IMPLANT
NEEDLE HYPO 25X1 1.5 SAFETY (NEEDLE) ×3 IMPLANT
NS IRRIG 500ML POUR BTL (IV SOLUTION) ×3 IMPLANT
PACK BASIN DAY SURGERY FS (CUSTOM PROCEDURE TRAY) ×3 IMPLANT
PENCIL BUTTON HOLSTER BLD 10FT (ELECTRODE) ×3 IMPLANT
SUPPORT SCROTAL LG STRP (MISCELLANEOUS) ×2 IMPLANT
SUPPORTER ATHLETIC LG (MISCELLANEOUS) ×1
SUT MNCRL AB 4-0 PS2 18 (SUTURE) ×3 IMPLANT
SUT SILK 0 TIES 10X30 (SUTURE) ×3 IMPLANT
SUT VIC AB 3-0 SH 27 (SUTURE) ×2
SUT VIC AB 3-0 SH 27X BRD (SUTURE) ×1 IMPLANT
SYR CONTROL 10ML LL (SYRINGE) ×3 IMPLANT
TOWEL OR 17X24 6PK STRL BLUE (TOWEL DISPOSABLE) ×6 IMPLANT
TRAY DSU PREP LF (CUSTOM PROCEDURE TRAY) ×3 IMPLANT
TUBE CONNECTING 12'X1/4 (SUCTIONS) ×1
TUBE CONNECTING 12X1/4 (SUCTIONS) ×2 IMPLANT
YANKAUER SUCT BULB TIP NO VENT (SUCTIONS) ×3 IMPLANT

## 2015-09-21 NOTE — Anesthesia Preprocedure Evaluation (Addendum)
Anesthesia Evaluation  Patient identified by MRN, date of birth, ID band Patient awake    Reviewed: Allergy & Precautions, NPO status , Patient's Chart, lab work & pertinent test results  History of Anesthesia Complications Negative for: history of anesthetic complications  Airway Mallampati: I  TM Distance: >3 FB Neck ROM: Full    Dental  (+) Edentulous Upper, Edentulous Lower   Pulmonary sleep apnea and Continuous Positive Airway Pressure Ventilation , former smoker (quit 1980),    breath sounds clear to auscultation       Cardiovascular hypertension, Pt. on medications (-) angina+ CAD (25% LAD by cath)  + dysrhythmias (s/p ablation ) Atrial Fibrillation  Rhythm:Regular Rate:Normal  '13 cath: mild CAD, normal LV function  '13 ECHO: EF 55-60%, valves OK   Neuro/Psych Anxiety Depression Cerebellar ataxia Chronic back pain: narcotics    GI/Hepatic Neg liver ROS, GERD  Medicated and Controlled,  Endo/Other  diabetes, Oral Hypoglycemic AgentsMorbid obesity  Renal/GU negative Renal ROS     Musculoskeletal   Abdominal (+) + obese,   Peds  Hematology negative hematology ROS (+)   Anesthesia Other Findings   Reproductive/Obstetrics                          Anesthesia Physical Anesthesia Plan  ASA: III  Anesthesia Plan: General   Post-op Pain Management:    Induction: Intravenous  Airway Management Planned: LMA  Additional Equipment:   Intra-op Plan:   Post-operative Plan:   Informed Consent: I have reviewed the patients History and Physical, chart, labs and discussed the procedure including the risks, benefits and alternatives for the proposed anesthesia with the patient or authorized representative who has indicated his/her understanding and acceptance.     Plan Discussed with: CRNA and Surgeon  Anesthesia Plan Comments: (Plan routine monitors, GA- LMA OK)         Anesthesia Quick Evaluation

## 2015-09-21 NOTE — Transfer of Care (Signed)
Immediate Anesthesia Transfer of Care Note  Patient: Richard Bradshaw  Procedure(s) Performed: Procedure(s) (LRB): RIGHT INGUINAL RADICAL ORCHIECTOMY (Right)  Patient Location: PACU  Anesthesia Type: General  Level of Consciousness: awake, oriented, sedated and patient cooperative  Airway & Oxygen Therapy: Patient Spontanous Breathing and Patient connected to face mask oxygen  Post-op Assessment: Report given to PACU RN and Post -op Vital signs reviewed and stable  Post vital signs: Reviewed and stable  Complications: No apparent anesthesia complications

## 2015-09-21 NOTE — Anesthesia Postprocedure Evaluation (Signed)
Anesthesia Post Note  Patient: Richard Bradshaw  Procedure(s) Performed: Procedure(s) (LRB): RIGHT INGUINAL RADICAL ORCHIECTOMY (Right)  Patient location during evaluation: PACU Anesthesia Type: General Level of consciousness: awake and alert, oriented and patient cooperative Pain management: pain level controlled Vital Signs Assessment: post-procedure vital signs reviewed and stable Respiratory status: spontaneous breathing, nonlabored ventilation and respiratory function stable Cardiovascular status: blood pressure returned to baseline and stable Postop Assessment: no signs of nausea or vomiting Anesthetic complications: no    Last Vitals:  Filed Vitals:   09/21/15 1000 09/21/15 1015  BP: 100/73 103/69  Pulse: 81 75  Temp:    Resp: 14 13    Last Pain:  Filed Vitals:   09/21/15 1025  PainSc: 5                  Naveya Ellerman,E. Quinlin Conant

## 2015-09-21 NOTE — Brief Op Note (Signed)
09/21/2015  9:16 AM  PATIENT:  Richard Bradshaw  65 y.o. male  PRE-OPERATIVE DIAGNOSIS:  RIGHT TESTICULAR MASS  SWELLING  POST-OPERATIVE DIAGNOSIS:  RIGHT TESTICULAR MASS  SWELLING  PROCEDURE:  Procedure(s): RIGHT INGUINAL RADICAL ORCHIECTOMY (Right)  SURGEON:  Surgeon(s) and Role:    * Alexis Frock, MD - Primary  PHYSICIAN ASSISTANT:   ASSISTANTS: none   ANESTHESIA:   local and general  EBL:  Total I/O In: 200 [I.V.:200] Out: -   BLOOD ADMINISTERED:none  DRAINS: none   LOCAL MEDICATIONS USED:  MARCAINE     SPECIMEN:  Source of Specimen:  Rt testicle and cord  DISPOSITION OF SPECIMEN:  PATHOLOGY  COUNTS:  YES  TOURNIQUET:  * No tourniquets in log *  DICTATION: .Other Dictation: Dictation Number [id number]  PLAN OF CARE: Discharge to home after PACU  PATIENT DISPOSITION:  PACU - hemodynamically stable.   Delay start of Pharmacological VTE agent (>24hrs) due to surgical blood loss or risk of bleeding: yes

## 2015-09-21 NOTE — H&P (Signed)
Richard Bradshaw is an 65 y.o. male.    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 Had a piece of lumber his the area while woodworking 09/2015 and wtih acute pain that is slowly improving. No drastic increase in size / consistancy. 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 [place] confirms large Rt spermatocele but are of hypervascular solid tissue contiguous with lower pole of testicle somewhat concerning for neoplasm. CMP, AFP, HCG normal.  PMH sig for obesity, AFib / Ablation (now sinus per report, no blood thinners), pulmonary hypertension.  Today Richard Bradshaw is seen to proceed with right orchiectomy for his bothersome Rt scrotal swelling and possible neoplasm.   Past Medical History  Diagnosis Date  . Diabetes mellitus     type 2  . 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 Hyperlipidemia   . Obesity   . Anxiety   . Adult onset fluency disorder   . Depressive disorder   . Obstructive sleep apnea     on CPAP  . Cerebellar ataxia (Richard Bradshaw)   . Essential hypertension   . Angina pectoris     s/p cath 09/2011 with mild CAD, normal LV function and normal right heart pressures.   . Varicose vein   . Sinusitis   . Esophageal reflux   . Hypertrophy (benign) of prostate   . Hyperplasia of prostate   . Monoarthritis     lower leg  . Neuritis   . Insomnia   . Fatigue   . SOB (shortness of breath)     chronic  . Prostate cancer (Richard Bradshaw)   . Dizziness   . Pulmonary HTN (Richard Bradshaw)     not noted on cath 09/2011; Had normal right heart pressures  . Morbid obesity (Richard Bradshaw)   . Dysrhythmia     hx of a-fib  . Testicular mass   . Neuralgia     l arm  . Radiculitis     Past Surgical History  Procedure Laterality Date  . Appendectomy    . Tonsillectomy    . Cholecystectomy  2008  . Colonoscopy  06/13/2004   . Other surgical history  01/05/2004    ETT--Nuclear  . Atrial fibrillation ablation  2006    x2  . Tonsillectomy    . Vascular surgery      vericose vein removed  . Cardiac catheterization  2008;2013    x2  . Gold seed implants  2012  . Prostate biopsy  2012    hypoglycemic w loss of consciousness    Family History  Problem Relation Age of Onset  . Hypertension Mother   . Diabetes Mother   . Heart disease Mother   . Cancer Father   . Hypertension Sister    Social History:  reports that he quit smoking about 37 years ago. His smokeless tobacco use includes Chew. He reports that he does not drink alcohol or use illicit drugs.  Allergies:  Allergies  Allergen Reactions  . Levaquin [Levofloxacin In D5w]     ? rash  . Penicillins Other (See Comments)    Doesn't remember    No prescriptions prior to admission    No results found for this or any previous visit (from the past 48 hour(s)). No results found.  Review of Systems  Constitutional: Negative.   HENT: Negative.  Eyes: Negative.   Respiratory: Negative.   Cardiovascular: Negative.   Gastrointestinal: Negative.   Genitourinary: Negative.        Rt scrotal swelling / pain  Musculoskeletal: Negative.   Skin: Negative.   Neurological: Negative.   Endo/Heme/Allergies: Negative.   Psychiatric/Behavioral: Negative.     Height 5\' 9"  (1.753 m), weight 112.492 kg (248 lb). Physical Exam  Constitutional: He appears well-developed.  HENT:  Head: Normocephalic.  Eyes: Pupils are equal, round, and reactive to light.  Neck: Normal range of motion.  Cardiovascular: Normal rate.   Respiratory: Effort normal.  GI: Soft.  Genitourinary:  Stable Rt scrotal swelling and firmness over inferior testicle.   Musculoskeletal: Normal range of motion.  Neurological: He is alert.  Skin: Skin is warm.  Psychiatric: He has a normal mood and affect. His behavior is normal. Judgment and thought content normal.      Assessment/Plan  1 -Scrotal Pain / Injury / Rt Spermatocele and Mass -  no obvious fracture / hematoma / or acute sequelae of trauma. Lower pole testis area concerning for possible neoplasm (possibly adenomatoid or other slow-growing given time course). Discussed options of surveillance v. orchiectomy  (inguinal) v. spermatocelectomy (scrotal, possible testis BX) and we both agree on inguinal approach radical right orchiectomy. He has been considering spermatoceletomy for years and just wants "rid of it all". Peri-op course, risks / benefits rediscussed again today.   Recent CT favorable.     Alexis Frock, MD 09/21/2015, 6:16 AM

## 2015-09-21 NOTE — Discharge Instructions (Signed)
1 - Call MD or go to ER for fever >102, severe pain / nausea / vomiting not relieved by medications, or acute change in medical status.   2 - Dr. Tresa Moore will call you with pathology results when available.     HOME CARE INSTRUCTIONS FOR SCROTAL PROCEDURES  Wound Care & Hygiene: You may apply an ice bag to the scrotum for the first 24 hours.  This may help decrease swelling and soreness.  You may have a dressing held in place by an athletic supporter.  You may remove the dressing in 24 hours and shower in 48 hours.  Continue to use the athletic supporter or tight briefs for at least a week. Activity: Rest today - not necessarily flat bed rest.  Just take it easy.  You should not do strenuous activities until your follow-up visit with your doctor.  You may resume light activity in 48 hours.  Return to Work:  Your doctor will advise you of this depending on the type of work you do  Diet: Drink liquids or eat a light diet this evening.  You may resume a regular diet tomorrow.  General Expectations: You may have a small amount of bleeding.  The scrotum may be swollen or bruised for about a week.  Call your Doctor if these occur:  -persistent or heavy bleeding  -temperature of 101 degrees or more  -severe pain, not relieved by your pain medication  Return to [employer]:  Call to set up and appointment.  Patient Signature:  __________________________________________________  Nurse's Signature:  __________________________________________________  Post Anesthesia Home Care Instructions  Activity: Get plenty of rest for the remainder of the day. A responsible adult should stay with you for 24 hours following the procedure.  For the next 24 hours, DO NOT: -Drive a car -Paediatric nurse -Drink alcoholic beverages -Take any medication unless instructed by your physician -Make any legal decisions or sign important papers.  Meals: Start with liquid foods such as gelatin or soup.  Progress to regular foods as tolerated. Avoid greasy, spicy, heavy foods. If nausea and/or vomiting occur, drink only clear liquids until the nausea and/or vomiting subsides. Call your physician if vomiting continues.  Special Instructions/Symptoms: Your throat may feel dry or sore from the anesthesia or the breathing tube placed in your throat during surgery. If this causes discomfort, gargle with warm salt water. The discomfort should disappear within 24 hours.  If you had a scopolamine patch placed behind your ear for the management of post- operative nausea and/or vomiting:  1. The medication in the patch is effective for 72 hours, after which it should be removed.  Wrap patch in a tissue and discard in the trash. Wash hands thoroughly with soap and water. 2. You may remove the patch earlier than 72 hours if you experience unpleasant side effects which may include dry mouth, dizziness or visual disturbances. 3. Avoid touching the patch. Wash your hands with soap and water after contact with the patch.

## 2015-09-21 NOTE — Anesthesia Procedure Notes (Signed)
Procedure Name: LMA Insertion Date/Time: 09/21/2015 8:31 AM Performed by: Denna Haggard D Pre-anesthesia Checklist: Patient identified, Emergency Drugs available, Suction available and Patient being monitored Patient Re-evaluated:Patient Re-evaluated prior to inductionOxygen Delivery Method: Circle System Utilized Preoxygenation: Pre-oxygenation with 100% oxygen Intubation Type: IV induction Ventilation: Mask ventilation without difficulty LMA: LMA inserted LMA Size: 4.0 Number of attempts: 1 Airway Equipment and Method: Bite block Placement Confirmation: positive ETCO2 Tube secured with: Tape Dental Injury: Teeth and Oropharynx as per pre-operative assessment

## 2015-09-22 ENCOUNTER — Encounter (HOSPITAL_BASED_OUTPATIENT_CLINIC_OR_DEPARTMENT_OTHER): Payer: Self-pay | Admitting: Urology

## 2015-09-22 NOTE — Op Note (Signed)
Richard Bradshaw NO.:  [id number]  MEDICAL RECORD NO.:  [id number]  LOCATION:                                 FACILITY:  PHYSICIAN:  Alexis Frock, MD     DATE OF BIRTH:  Jun 14, 1951  DATE OF PROCEDURE: 09/21/2015                               OPERATIVE REPORT  PREOPERATIVE DIAGNOSIS:  Chronic right scrotal swelling, recent right scrotal injury, questionable right epididymal versus inferior testicular mass.  PROCEDURE PERFORMED:  Right radical inguinal orchiectomy.  ESTIMATED BLOOD LOSS:  Nil.  COMPLICATION:  None.  SPECIMEN:  Right testicle with proximal cord.  FINDINGS:  Very firm area at head of epididymis, inferior testicle concerning for possible neoplasm.  INDICATION:  Richard Bradshaw is a very pleasant 65 year old gentleman, whom we have followed for several years for several chronic urologic conditions. He has had some mild intermittent right scrotal discomfort for some time.  He recently had an acute scrotal injury while using a table saw where a piece of lumber was ejected from the saw and hit his scrotum. He was evaluated after this and found to have no obvious acute injury, but ultrasound did reveal an area of questionable hypervascular mass like area at the inferior pole of his testicle, the head of the epididymis area.  Palpation of this area also revealed a very firm structure worrisome for possible neoplasm.  This area in question did not have any significant findings concerning for traumatic etiology though this was likely incidental.  Options were discussed for management including serial exams and ultrasound versus surgical extirpation and the patient adamantly wished to proceed with the latter. Notably tumor markers were all negative.  He had recent CT abdomen and pelvis, which was unremarkable.  Informed consent was obtained and placed in medical record.  PROCEDURE IN DETAIL:  The patient being Richard Bradshaw was verified. Procedure  being right radical inguinal orchiectomy was confirmed. Exquisitely careful dissection was performed.  Intravenous antibiotics were administered.  General LMA anesthesia was induced.  Patient was placed into a supine position.  A sterile field was created by prepping and draping the patient's penis, perineum, and scrotum and right groin after clipper shaving was done using iodinated prep.  Next, an approximately 3.5 cm curvilinear incision was made along Langer's lines. Beginning approximately 1 fingerbreadth superior lateral to the pubic tubercle and extending superior laterally.  Dissection was proceeded down through copious subcutaneous fat to Scarpa's fascia, which was incised by placing the right testicle on some dependent intermittent traction.  The area of the cord was palpated.  Dissection proceeded in this area.  The cord was circumferentially mobilized.  A Penrose drain was placed around this, so had cord control.  Using superior traction via the scrotum the testicle was delivered through the inguinal incision and lucid urine tissue was dissected away taking great care to avoid any violation in the tunica vaginalis, which did not occur.  The cord was dissected proximally such that it was directly at the inguinal ring and such that with slight inferior traction we were able to obtain cord control proximal to the ring.  At this point, the proximal cord was triply ligated  using silk tie and coldly transected, completely freeing of the right location of specimens, which was set aside for permanent pathology.  The cord was completely hemostatic.  A purposeful long tag was left in place on one of the silk ties approximately 4 cm, for later identification should be necessary.  The scrotum was inspected via the inguinal incision, was found to be hemostatic.  Given the copious subcutaneous fat Scarpa's was reapproximated in two layers using running Vicryl.  10 mL of 0.25% Marcaine were  infiltrated into the wound edges and the skin was reapproximated using subcuticular Monocryl followed by Dermabond, procedure terminated.  The patient tolerated the procedure well with no immediate periprocedural complications.  The patient was taken to the Postanesthesia Care Unit in stable condition.          ______________________________ Alexis Frock, MD     TM/MEDQ  D:  09/21/2015  T:  09/21/2015  Job:  [id number]
